# Patient Record
Sex: Female | Born: 1970 | Race: White | Hispanic: No | Marital: Single
Health system: Midwestern US, Community
[De-identification: ages and names within clinical notes are randomized; demographics above are authoritative.]

## PROBLEM LIST (undated history)

## (undated) DIAGNOSIS — Z8489 Family history of other specified conditions: Secondary | ICD-10-CM

## (undated) DIAGNOSIS — F32A Depression, unspecified: Secondary | ICD-10-CM

## (undated) DIAGNOSIS — L709 Acne, unspecified: Secondary | ICD-10-CM

## (undated) DIAGNOSIS — R112 Nausea with vomiting, unspecified: Secondary | ICD-10-CM

## (undated) DIAGNOSIS — J3089 Other allergic rhinitis: Secondary | ICD-10-CM

## (undated) DIAGNOSIS — N926 Irregular menstruation, unspecified: Secondary | ICD-10-CM

## (undated) DIAGNOSIS — R7303 Prediabetes: Secondary | ICD-10-CM

## (undated) DIAGNOSIS — N946 Dysmenorrhea, unspecified: Secondary | ICD-10-CM

## (undated) DIAGNOSIS — T8859XA Other complications of anesthesia, initial encounter: Secondary | ICD-10-CM

## (undated) DIAGNOSIS — J45909 Unspecified asthma, uncomplicated: Secondary | ICD-10-CM

## (undated) DIAGNOSIS — M1990 Unspecified osteoarthritis, unspecified site: Secondary | ICD-10-CM

## (undated) DIAGNOSIS — Z973 Presence of spectacles and contact lenses: Secondary | ICD-10-CM

## (undated) DIAGNOSIS — J189 Pneumonia, unspecified organism: Secondary | ICD-10-CM

## (undated) DIAGNOSIS — A6000 Herpesviral infection of urogenital system, unspecified: Secondary | ICD-10-CM

## (undated) HISTORY — DX: Dysmenorrhea, unspecified: N94.6

## (undated) HISTORY — DX: Other complications of anesthesia, initial encounter: T88.59XA

## (undated) HISTORY — DX: Nausea with vomiting, unspecified: R11.2

## (undated) HISTORY — DX: Acne, unspecified: L70.9

## (undated) HISTORY — DX: Pneumonia, unspecified organism: J18.9

## (undated) HISTORY — DX: Unspecified osteoarthritis, unspecified site: M19.90

## (undated) HISTORY — DX: Irregular menstruation, unspecified: N92.6

## (undated) HISTORY — DX: Depression, unspecified: F32.A

## (undated) HISTORY — DX: Prediabetes: R73.03

## (undated) HISTORY — DX: Unspecified asthma, uncomplicated: J45.909

---

## 1999-02-11 ENCOUNTER — Emergency Department (HOSPITAL_COMMUNITY): Admission: EM | Admit: 1999-02-11 | Discharge: 1999-02-11 | Payer: Self-pay | Admitting: Emergency Medicine

## 1999-06-21 HISTORY — PX: COLONOSCOPY: SHX174

## 2000-02-02 ENCOUNTER — Ambulatory Visit (HOSPITAL_COMMUNITY): Admission: RE | Admit: 2000-02-02 | Discharge: 2000-02-02 | Payer: Self-pay | Admitting: Gastroenterology

## 2001-04-13 ENCOUNTER — Other Ambulatory Visit: Admission: RE | Admit: 2001-04-13 | Discharge: 2001-04-13 | Payer: Self-pay | Admitting: Internal Medicine

## 2001-10-26 ENCOUNTER — Other Ambulatory Visit: Admission: RE | Admit: 2001-10-26 | Discharge: 2001-10-26 | Payer: Self-pay | Admitting: Obstetrics and Gynecology

## 2002-02-13 ENCOUNTER — Ambulatory Visit (HOSPITAL_COMMUNITY): Admission: RE | Admit: 2002-02-13 | Discharge: 2002-02-13 | Payer: Self-pay | Admitting: Obstetrics and Gynecology

## 2002-02-13 ENCOUNTER — Encounter: Payer: Self-pay | Admitting: Obstetrics and Gynecology

## 2002-03-08 ENCOUNTER — Inpatient Hospital Stay (HOSPITAL_COMMUNITY): Admission: AD | Admit: 2002-03-08 | Discharge: 2002-03-08 | Payer: Self-pay | Admitting: Obstetrics and Gynecology

## 2002-05-29 ENCOUNTER — Inpatient Hospital Stay (HOSPITAL_COMMUNITY): Admission: AD | Admit: 2002-05-29 | Discharge: 2002-05-31 | Payer: Self-pay | Admitting: Obstetrics and Gynecology

## 2002-07-19 ENCOUNTER — Encounter: Admission: RE | Admit: 2002-07-19 | Discharge: 2002-08-18 | Payer: Self-pay | Admitting: Obstetrics and Gynecology

## 2003-06-09 ENCOUNTER — Other Ambulatory Visit: Admission: RE | Admit: 2003-06-09 | Discharge: 2003-06-09 | Payer: Self-pay | Admitting: Obstetrics and Gynecology

## 2004-07-05 ENCOUNTER — Other Ambulatory Visit: Admission: RE | Admit: 2004-07-05 | Discharge: 2004-07-05 | Payer: Self-pay | Admitting: Obstetrics and Gynecology

## 2005-07-13 ENCOUNTER — Other Ambulatory Visit: Admission: RE | Admit: 2005-07-13 | Discharge: 2005-07-13 | Payer: Self-pay | Admitting: Obstetrics and Gynecology

## 2006-04-13 ENCOUNTER — Encounter: Admission: RE | Admit: 2006-04-13 | Discharge: 2006-07-12 | Payer: Self-pay | Admitting: Obstetrics and Gynecology

## 2006-05-25 ENCOUNTER — Inpatient Hospital Stay (HOSPITAL_COMMUNITY): Admission: AD | Admit: 2006-05-25 | Discharge: 2006-05-25 | Payer: Self-pay | Admitting: Obstetrics and Gynecology

## 2006-05-30 ENCOUNTER — Ambulatory Visit (HOSPITAL_COMMUNITY): Admission: RE | Admit: 2006-05-30 | Discharge: 2006-05-30 | Payer: Self-pay | Admitting: Obstetrics and Gynecology

## 2006-06-12 ENCOUNTER — Inpatient Hospital Stay (HOSPITAL_COMMUNITY): Admission: AD | Admit: 2006-06-12 | Discharge: 2006-06-12 | Payer: Self-pay | Admitting: Obstetrics and Gynecology

## 2006-06-19 ENCOUNTER — Ambulatory Visit (HOSPITAL_COMMUNITY): Admission: RE | Admit: 2006-06-19 | Discharge: 2006-06-19 | Payer: Self-pay | Admitting: Obstetrics and Gynecology

## 2006-07-18 ENCOUNTER — Inpatient Hospital Stay (HOSPITAL_COMMUNITY): Admission: RE | Admit: 2006-07-18 | Discharge: 2006-07-21 | Payer: Self-pay | Admitting: Obstetrics and Gynecology

## 2010-07-27 ENCOUNTER — Other Ambulatory Visit: Payer: Self-pay | Admitting: Internal Medicine

## 2010-07-27 ENCOUNTER — Ambulatory Visit
Admission: RE | Admit: 2010-07-27 | Discharge: 2010-07-27 | Disposition: A | Discharge status: Home / Self Care | Payer: 59 | Source: Ambulatory Visit | Attending: Internal Medicine | Admitting: Internal Medicine

## 2010-07-27 DIAGNOSIS — R52 Pain, unspecified: Secondary | ICD-10-CM

## 2011-03-17 ENCOUNTER — Ambulatory Visit
Admission: RE | Admit: 2011-03-17 | Discharge: 2011-03-17 | Disposition: A | Discharge status: Home / Self Care | Payer: 59 | Source: Ambulatory Visit | Attending: Internal Medicine | Admitting: Internal Medicine

## 2011-03-17 ENCOUNTER — Other Ambulatory Visit: Payer: Self-pay | Admitting: Internal Medicine

## 2011-03-17 DIAGNOSIS — R05 Cough: Secondary | ICD-10-CM

## 2011-04-29 ENCOUNTER — Ambulatory Visit
Admission: RE | Admit: 2011-04-29 | Discharge: 2011-04-29 | Disposition: A | Discharge status: Home / Self Care | Payer: 59 | Source: Ambulatory Visit | Attending: Internal Medicine | Admitting: Internal Medicine

## 2011-04-29 ENCOUNTER — Other Ambulatory Visit: Payer: Self-pay | Admitting: Internal Medicine

## 2011-04-29 DIAGNOSIS — R05 Cough: Secondary | ICD-10-CM

## 2011-09-23 ENCOUNTER — Encounter: Payer: 59 | Admitting: Obstetrics and Gynecology

## 2012-05-01 ENCOUNTER — Other Ambulatory Visit: Payer: Self-pay

## 2012-05-01 MED ORDER — VALACYCLOVIR HCL 500 MG PO TABS: 500.0000 mg | ORAL_TABLET | Freq: Every day | ORAL | Status: DC

## 2012-05-07 ENCOUNTER — Encounter: Payer: Self-pay | Admitting: Obstetrics and Gynecology

## 2012-05-07 ENCOUNTER — Ambulatory Visit (INDEPENDENT_AMBULATORY_CARE_PROVIDER_SITE_OTHER): Payer: 59 | Admitting: Obstetrics and Gynecology

## 2012-05-07 VITALS — BP 100/62 | Resp 16 | Ht 68.0 in | Wt 210.0 lb

## 2012-05-07 DIAGNOSIS — Z01419 Encounter for gynecological examination (general) (routine) without abnormal findings: Secondary | ICD-10-CM

## 2012-05-07 DIAGNOSIS — B009 Herpesviral infection, unspecified: Secondary | ICD-10-CM | POA: Insufficient documentation

## 2012-05-07 DIAGNOSIS — L709 Acne, unspecified: Secondary | ICD-10-CM | POA: Insufficient documentation

## 2012-05-07 DIAGNOSIS — N946 Dysmenorrhea, unspecified: Secondary | ICD-10-CM | POA: Insufficient documentation

## 2012-05-07 DIAGNOSIS — Z124 Encounter for screening for malignant neoplasm of cervix: Secondary | ICD-10-CM

## 2012-05-07 DIAGNOSIS — N926 Irregular menstruation, unspecified: Secondary | ICD-10-CM | POA: Insufficient documentation

## 2012-05-07 MED ORDER — VALACYCLOVIR HCL 500 MG PO TABS: 500.0000 mg | ORAL_TABLET | Freq: Every day | ORAL | Status: AC

## 2012-05-07 MED ORDER — IBUPROFEN 800 MG PO TABS: 800.0000 mg | ORAL_TABLET | Freq: Three times a day (TID) | ORAL | Status: DC | PRN

## 2012-05-07 NOTE — Progress Notes (Signed)
ANNUAL GYNECOLOGIC EXAMINATION   Cheryl Lynch is a 41 y.o. female, G3P2, who presents for an annual exam. The patient had a Mirena IUD placed December 2012 because of fibroids and heavy bleeding.  She is no longer having periods.  She complains of increased acne.  She has a history of herpes. She is having trouble losing weight. She has occasional stress incontinence.    History   Social History  . Marital Status: Married    Spouse Name: N/A    Number of Children: N/A  . Years of Education: N/A   Social History Main Topics  . Smoking status: Never Smoker   . Smokeless tobacco: Never Used  . Alcohol Use: Yes     Comment: socially   . Drug Use: No  . Sexually Active: Yes -- Female partner(s)    Birth Control/ Protection: IUD   Other Topics Concern  . None   Social History Narrative  . None    Menstrual cycle:   LMP: Patient's last menstrual period was 08/08/2011.             The following portions of the patient's history were reviewed and updated as appropriate: allergies, current medications, past family history, past medical history, past social history, past surgical history and problem list.  Review of Systems Pertinent items are noted in HPI. Breast:Negative for breast lump,nipple discharge or nipple retraction Gastrointestinal: Negative for abdominal pain, change in bowel habits or rectal bleeding Urinary:negative   Objective:    BP 100/62  Resp 16  Ht 5\' 8"  (1.727 m)  Wt 210 lb (95.255 kg)  BMI 31.93 kg/m2  LMP 08/08/2011    Weight:  Wt Readings from Last 1 Encounters:  05/07/12 210 lb (95.255 kg)          BMI: Body mass index is 31.93 kg/(m^2).  General Appearance: Alert, appropriate appearance for age. No acute distress HEENT: Grossly normal, acne Neck / Thyroid: Supple, no masses, nodes or enlargement Lungs: clear to auscultation bilaterally Back: No CVA tenderness Breast Exam: No masses or nodes.No dimpling, nipple retraction or  discharge. Cardiovascular: Regular rate and rhythm. S1, S2, no murmur Gastrointestinal: Soft, non-tender, no masses or organomegaly  ++++++++++++++++++++++++++++++++++++++++++++++++++++++++  Pelvic Exam: External genitalia: normal general appearance Vaginal: normal without tenderness, induration or masses. Relaxation: Yes Cervix: normal appearance, IUD string present Adnexa: normal bimanual exam Uterus: upper limits of normal size, shape, and consistency Rectovaginal: normal rectal, no masses  ++++++++++++++++++++++++++++++++++++++++++++++++++++++++  Lymphatic Exam: Non-palpable nodes in neck, clavicular, axillary, or inguinal regions Neurologic: Normal speech, no tremor  Psychiatric: Alert and oriented, appropriate affect.  Assessment:    Normal gyn exam   Overweight or obese: Yes   Pelvic relaxation: Yes  Acne  Herpes virus  Stable fibroids   Plan:    pap smear return annually or prn Contraception:IUD  Referred to a dermatologist    Medications prescribed: Valtrex 500 mg each day Motrin 800 mg every 8 hours as needed for pain  STD screen request: No   The updated Pap smear screening guidelines were discussed with the patient. The patient requested that I obtain a Pap smear: Yes.  Kegel exercises discussed: Yes.  Proper diet and regular exercise were reviewed.  Annual mammograms recommended starting at age 28. Proper breast care was discussed.  Screening colonoscopy is recommended beginning at age 25.  Regular health maintenance was reviewed.  Sleep hygiene was discussed.  Adequate calcium and vitamin D intake was emphasized.  Mylinda Latina.D.  Regular Periods: no Mammogram: no  Monthly Breast Ex.: no Exercise: yes  Tetanus < 10 years: no *Pt is unsure* Seatbelts: yes  NI. Bladder Functn.: no "Pt notices when she sneezes she leaks a little " Abuse at home: no  Daily BM's: yes Stressful Work: yes  Healthy Diet: yes  Sigmoid-Colonoscopy: about 10 years ago per pt   Calcium: no Medical problems this year: pt believes mirena IUD has caused her acne to flare up.    LAST PAP:01/31/2011 "WNL"   Contraception: Mirena   Mammogram:  N/A   PCP: Dr.Kimberly Renae Gloss   PMH: No Changes  FMH: No Changes  Last Bone Scan: Never

## 2012-05-09 LAB — PAP IG W/ RFLX HPV ASCU

## 2014-01-28 ENCOUNTER — Other Ambulatory Visit: Payer: Self-pay | Admitting: Obstetrics and Gynecology

## 2014-01-28 DIAGNOSIS — Z1231 Encounter for screening mammogram for malignant neoplasm of breast: Secondary | ICD-10-CM

## 2014-02-03 ENCOUNTER — Ambulatory Visit
Admission: RE | Admit: 2014-02-03 | Discharge: 2014-02-03 | Disposition: A | Discharge status: Home / Self Care | Payer: 59 | Source: Ambulatory Visit | Attending: Obstetrics and Gynecology | Admitting: Obstetrics and Gynecology

## 2014-02-03 DIAGNOSIS — Z1231 Encounter for screening mammogram for malignant neoplasm of breast: Secondary | ICD-10-CM

## 2014-02-05 ENCOUNTER — Other Ambulatory Visit: Payer: Self-pay | Admitting: Obstetrics and Gynecology

## 2014-02-05 DIAGNOSIS — R928 Other abnormal and inconclusive findings on diagnostic imaging of breast: Secondary | ICD-10-CM

## 2014-02-11 ENCOUNTER — Ambulatory Visit
Admission: RE | Admit: 2014-02-11 | Discharge: 2014-02-11 | Disposition: A | Discharge status: Home / Self Care | Payer: 59 | Source: Ambulatory Visit | Attending: Obstetrics and Gynecology | Admitting: Obstetrics and Gynecology

## 2014-02-11 DIAGNOSIS — R928 Other abnormal and inconclusive findings on diagnostic imaging of breast: Secondary | ICD-10-CM

## 2014-04-22 ENCOUNTER — Ambulatory Visit: Payer: Self-pay | Admitting: Podiatry

## 2014-04-22 MED ORDER — IBUPROFEN 800 MG PO TABS: 800.0000 mg | ORAL_TABLET | Freq: Three times a day (TID) | ORAL | Status: DC | PRN

## 2014-04-22 NOTE — Progress Notes (Unsigned)
Entry Error

## 2014-04-23 ENCOUNTER — Encounter: Payer: Self-pay | Admitting: Podiatry

## 2014-04-23 ENCOUNTER — Ambulatory Visit (INDEPENDENT_AMBULATORY_CARE_PROVIDER_SITE_OTHER): Payer: 59 | Admitting: Podiatry

## 2014-04-23 VITALS — BP 129/83 | HR 68 | Ht 68.0 in | Wt 215.0 lb

## 2014-04-23 DIAGNOSIS — M7741 Metatarsalgia, right foot: Secondary | ICD-10-CM

## 2014-04-23 DIAGNOSIS — M79606 Pain in leg, unspecified: Secondary | ICD-10-CM | POA: Insufficient documentation

## 2014-04-23 DIAGNOSIS — M216X9 Other acquired deformities of unspecified foot: Secondary | ICD-10-CM | POA: Insufficient documentation

## 2014-04-23 DIAGNOSIS — M7742 Metatarsalgia, left foot: Secondary | ICD-10-CM

## 2014-04-23 DIAGNOSIS — L609 Nail disorder, unspecified: Secondary | ICD-10-CM | POA: Insufficient documentation

## 2014-04-23 DIAGNOSIS — M21969 Unspecified acquired deformity of unspecified lower leg: Secondary | ICD-10-CM | POA: Insufficient documentation

## 2014-04-23 NOTE — Patient Instructions (Signed)
Seen for painful feet. Both feet casted for orthotics and Metatarsal binder dispensed.

## 2014-04-23 NOTE — Progress Notes (Signed)
Subjective: 43 year old female presents with painful feet. She has had heel pain and was treated with Orthotics in past. Been wearing orthotics since 2009. They were effective for a while. Now they are not effective and feet are hurting on side of foot, heels, through out the foot and some times the pain shoots up to legs. Also the orthotics do not fit in dress shoes. Been doing this for over a year. Not on feet constant, but off and on 3-4 hours. Patient prefers to have orthotics for dress shoes if needed a new pair. Also stated that she has problematic nails that are difficult to trim.   Objective: Dermatologic: Abnormal skin growth under distal nail plates, 2-3 bilateral. Neurovascular status are within normal. Orthopedic: Flexible flat foot. Hypermobile first ray bilateral. Hallux valgus with bunion R>L.  Radiographic examination reveal short first ray/long 2nd metatarsal, and dorsally elevated first ray bilateral.  Assessment: Metatarsalgia bilateral. Hypermobile first ray bilateral. STJ hyperpronation bilateral. Lateral column foot pain. Abnormal nail growth.  Plan: Reviewed findings and available treatment options.  Metatarsal binder dispensed to provide added stability of the first ray. Both feet casted for Orthotics to fit dress shoes. Explained decreased effectiveness of orthotics when used with dress shoes. Advised to use old orthotics for Tennis shoes. May benefit from [place] osteotomy with bone graft to bring the first ray down and increase the length.

## 2014-08-01 ENCOUNTER — Ambulatory Visit: Payer: Self-pay | Admitting: Podiatry

## 2015-02-16 ENCOUNTER — Other Ambulatory Visit: Payer: Self-pay | Admitting: Obstetrics and Gynecology

## 2015-02-16 DIAGNOSIS — R921 Mammographic calcification found on diagnostic imaging of breast: Secondary | ICD-10-CM

## 2015-02-26 ENCOUNTER — Ambulatory Visit
Admission: RE | Admit: 2015-02-26 | Discharge: 2015-02-26 | Disposition: A | Discharge status: Home / Self Care | Payer: Commercial Managed Care - HMO | Source: Ambulatory Visit | Attending: Obstetrics and Gynecology | Admitting: Obstetrics and Gynecology

## 2015-02-26 DIAGNOSIS — R921 Mammographic calcification found on diagnostic imaging of breast: Secondary | ICD-10-CM

## 2015-11-03 ENCOUNTER — Other Ambulatory Visit: Payer: Self-pay | Admitting: Obstetrics and Gynecology

## 2015-11-12 ENCOUNTER — Other Ambulatory Visit: Payer: Self-pay | Admitting: Obstetrics and Gynecology

## 2015-12-01 ENCOUNTER — Other Ambulatory Visit: Payer: Self-pay | Admitting: Obstetrics and Gynecology

## 2015-12-09 ENCOUNTER — Encounter (HOSPITAL_BASED_OUTPATIENT_CLINIC_OR_DEPARTMENT_OTHER): Payer: Self-pay | Admitting: *Deleted

## 2015-12-09 NOTE — Progress Notes (Signed)
NPO AFTER MN.  ARRIVE  AT 0700.  NEEDS HG AND URINE PREG.

## 2015-12-10 ENCOUNTER — Inpatient Hospital Stay (HOSPITAL_COMMUNITY)
Admission: RE | Admit: 2015-12-10 | Discharge: 2015-12-10 | Disposition: A | Discharge status: Home / Self Care | Payer: Commercial Managed Care - HMO | Source: Ambulatory Visit

## 2015-12-14 NOTE — H&P (Signed)
Admission History and Physical Exam for a Gynecology Patient  Cheryl Lynch is a 45 y.o. female, G3P2, who presents for removal of a Mirena IUD, laparoscopic bilateral tubal sterilization, and NovaSure ablation of endometrium. She has been followed at the [facility] and Gynecology division of [facility]. She has had a Mirena IUD since 2012.  She is experiencing menorrhagia and dysmenorrhea.  She now elects for permanent sterilization and endometrial ablation.  Her endometrial biopsy was negative.  Most recent Pap smear was within normal limits.  OB History    Gravida Para Term Preterm AB TAB SAB Ectopic Multiple Living   3 2              Past Medical History  Diagnosis Date  . Dysmenorrhea   . Irregular periods/menstrual cycles   . Acne   . Genital HSV   . Wears contact lenses   . Environmental and seasonal allergies   . Family history of adverse reaction to anesthesia     mother-- ponv    No prescriptions prior to admission    Past Surgical History  Procedure Laterality Date  . Cesarean section  07-18-2001  . Colonoscopy  2001    Allergies  Allergen Reactions  . Latex Itching    Family History: family history includes Cancer in her maternal grandmother; Diabetes in her father.  Social History:  reports that she has never smoked. She has never used smokeless tobacco. She reports that she drinks alcohol. She reports that she does not use illicit drugs.  Review of systems: See HPI.  Admission Physical Exam:    Body mass index is 28.59 kg/(m^2).  Height 5\' 8"  (1.727 m), weight 188 lb (85.276 kg).  HEENT:                 Within normal limits Chest:                   Clear Heart:                    Regular rate and rhythm Breasts:                No masses, skin changes, bleeding, or discharge present Abdomen:             Nontender, no masses Extremities:          Grossly normal Neurologic exam: Grossly normal  Pelvic  exam:  External genitalia: normal general appearance Vaginal: cystocele present Cervix: normal appearance and IUD string visualized Adnexa: normal bimanual exam Uterus: normal size shape and consistency  Assessment:  Desires sterilization  Menorrhagia  Dysmenorrhea  Plan:  The patient will have her Mirena IUD removed.  She will then have a laparoscopic tubal sterilization procedure.  She will have an ablation of the endometrium.  The patient understands the indications for surgical procedure.  She understands her alternatives treatment options.  She accepts the risk of, but not limited to, anesthetic complications, bleeding, infections, and possible damage to the surrounding organs.   Eli Hose 12/14/2015

## 2015-12-14 NOTE — Anesthesia Preprocedure Evaluation (Addendum)
Anesthesia Evaluation  Patient identified by MRN, date of birth, ID band Patient awake    Reviewed: Allergy & Precautions, NPO status , Patient's Chart, lab work & pertinent test results  Airway Mallampati: II  TM Distance: >3 FB Neck ROM: Full    Dental  (+) Dental Advisory Given   Pulmonary neg pulmonary ROS,    breath sounds clear to auscultation       Cardiovascular negative cardio ROS   Rhythm:Regular Rate:Normal     Neuro/Psych negative neurological ROS     GI/Hepatic negative GI ROS, Neg liver ROS,   Endo/Other  negative endocrine ROS  Renal/GU negative Renal ROS     Musculoskeletal   Abdominal   Peds  Hematology negative hematology ROS (+)   Anesthesia Other Findings   Reproductive/Obstetrics                           No results found for: WBC, HGB, HCT, MCV, PLT No results found for: CREATININE, BUN, NA, K, CL, CO2  Anesthesia Physical Anesthesia Plan  ASA: I  Anesthesia Plan: General   Post-op Pain Management:    Induction: Intravenous  Airway Management Planned: Oral ETT  Additional Equipment:   Intra-op Plan:   Post-operative Plan: Extubation in OR  Informed Consent: I have reviewed the patients History and Physical, chart, labs and discussed the procedure including the risks, benefits and alternatives for the proposed anesthesia with the patient or authorized representative who has indicated his/her understanding and acceptance.   Dental advisory given  Plan Discussed with: CRNA  Anesthesia Plan Comments:        Anesthesia Quick Evaluation

## 2015-12-15 ENCOUNTER — Encounter (HOSPITAL_BASED_OUTPATIENT_CLINIC_OR_DEPARTMENT_OTHER): Payer: Self-pay | Admitting: *Deleted

## 2015-12-15 ENCOUNTER — Ambulatory Visit (HOSPITAL_BASED_OUTPATIENT_CLINIC_OR_DEPARTMENT_OTHER): Payer: Commercial Managed Care - HMO | Admitting: Anesthesiology

## 2015-12-15 ENCOUNTER — Encounter (HOSPITAL_BASED_OUTPATIENT_CLINIC_OR_DEPARTMENT_OTHER)
Admission: RE | Disposition: A | Discharge status: Home / Self Care | Payer: Self-pay | Source: Ambulatory Visit | Attending: Obstetrics and Gynecology

## 2015-12-15 ENCOUNTER — Ambulatory Visit (HOSPITAL_BASED_OUTPATIENT_CLINIC_OR_DEPARTMENT_OTHER)
Admission: RE | Admit: 2015-12-15 | Discharge: 2015-12-15 | Disposition: A | Discharge status: Home / Self Care | Payer: Commercial Managed Care - HMO | Source: Ambulatory Visit | Attending: Obstetrics and Gynecology | Admitting: Obstetrics and Gynecology

## 2015-12-15 DIAGNOSIS — Z79899 Other long term (current) drug therapy: Secondary | ICD-10-CM | POA: Diagnosis not present

## 2015-12-15 DIAGNOSIS — Z302 Encounter for sterilization: Secondary | ICD-10-CM | POA: Diagnosis not present

## 2015-12-15 DIAGNOSIS — Q512 Other doubling of uterus: Secondary | ICD-10-CM | POA: Insufficient documentation

## 2015-12-15 DIAGNOSIS — Z466 Encounter for fitting and adjustment of urinary device: Secondary | ICD-10-CM | POA: Insufficient documentation

## 2015-12-15 DIAGNOSIS — N920 Excessive and frequent menstruation with regular cycle: Secondary | ICD-10-CM | POA: Insufficient documentation

## 2015-12-15 HISTORY — PX: DILITATION & CURRETTAGE/HYSTROSCOPY WITH NOVASURE ABLATION: SHX5568

## 2015-12-15 HISTORY — DX: Presence of spectacles and contact lenses: Z97.3

## 2015-12-15 HISTORY — DX: Family history of other specified conditions: Z84.89

## 2015-12-15 HISTORY — DX: Herpesviral infection of urogenital system, unspecified: A60.00

## 2015-12-15 HISTORY — DX: Other allergic rhinitis: J30.89

## 2015-12-15 LAB — POCT I-STAT 4, (NA,K, GLUC, HGB,HCT)
GLUCOSE: 90 mg/dL (ref 65–99)
HEMATOCRIT: 38 % (ref 36.0–46.0)
HEMOGLOBIN: 12.9 g/dL (ref 12.0–15.0)
Potassium: 3.8 mmol/L (ref 3.5–5.1)
Sodium: 140 mmol/L (ref 135–145)

## 2015-12-15 LAB — POCT PREGNANCY, URINE: Preg Test, Ur: NEGATIVE

## 2015-12-15 SURGERY — DILATATION & CURETTAGE/HYSTEROSCOPY WITH NOVASURE ABLATION
Anesthesia: General

## 2015-12-15 MED ORDER — LACTATED RINGERS IV SOLN
INTRAVENOUS | Status: DC
  Administered 2015-12-15 (×3): via INTRAVENOUS
  Filled 2015-12-15: qty 1000

## 2015-12-15 MED ORDER — FENTANYL CITRATE (PF) 100 MCG/2ML IJ SOLN
INTRAMUSCULAR | Status: AC
  Filled 2015-12-15: qty 2

## 2015-12-15 MED ORDER — IBUPROFEN 800 MG PO TABS: 800.0000 mg | ORAL_TABLET | Freq: Three times a day (TID) | ORAL | Status: DC | PRN

## 2015-12-15 MED ORDER — WHITE PETROLATUM GEL
Status: AC
  Filled 2015-12-15: qty 5

## 2015-12-15 MED ORDER — HYDROCODONE-ACETAMINOPHEN 7.5-325 MG PO TABS
1.0000 | ORAL_TABLET | Freq: Once | ORAL | Status: AC | PRN
  Administered 2015-12-15: 1 via ORAL
  Filled 2015-12-15: qty 1

## 2015-12-15 MED ORDER — PROMETHAZINE HCL 25 MG/ML IJ SOLN
6.2500 mg | INTRAMUSCULAR | Status: DC | PRN
  Filled 2015-12-15: qty 1

## 2015-12-15 MED ORDER — LIDOCAINE 2% (20 MG/ML) 5 ML SYRINGE
INTRAMUSCULAR | Status: DC | PRN
  Administered 2015-12-15: 60 mg via INTRAVENOUS

## 2015-12-15 MED ORDER — MIDAZOLAM HCL 2 MG/2ML IJ SOLN
INTRAMUSCULAR | Status: AC
  Filled 2015-12-15: qty 2

## 2015-12-15 MED ORDER — LACTATED RINGERS IR SOLN
Status: DC | PRN
  Administered 2015-12-15: 3000 mL

## 2015-12-15 MED ORDER — HYDROMORPHONE HCL 1 MG/ML IJ SOLN
INTRAMUSCULAR | Status: AC
  Filled 2015-12-15: qty 1

## 2015-12-15 MED ORDER — MIDAZOLAM HCL 5 MG/5ML IJ SOLN
INTRAMUSCULAR | Status: DC | PRN
  Administered 2015-12-15: 2 mg via INTRAVENOUS

## 2015-12-15 MED ORDER — SCOPOLAMINE 1 MG/3DAYS TD PT72
MEDICATED_PATCH | TRANSDERMAL | Status: AC
  Filled 2015-12-15: qty 1

## 2015-12-15 MED ORDER — HYDROMORPHONE HCL 1 MG/ML IJ SOLN
0.2500 mg | INTRAMUSCULAR | Status: DC | PRN
  Administered 2015-12-15: 0.5 mg via INTRAVENOUS
  Filled 2015-12-15: qty 1

## 2015-12-15 MED ORDER — PROPOFOL 10 MG/ML IV BOLUS
INTRAVENOUS | Status: DC | PRN
  Administered 2015-12-15: 180 mg via INTRAVENOUS

## 2015-12-15 MED ORDER — PROPOFOL 10 MG/ML IV BOLUS
INTRAVENOUS | Status: AC
  Filled 2015-12-15: qty 20

## 2015-12-15 MED ORDER — ROCURONIUM BROMIDE 100 MG/10ML IV SOLN
INTRAVENOUS | Status: AC
Start: 2015-12-15 — End: 2015-12-15
  Filled 2015-12-15: qty 1

## 2015-12-15 MED ORDER — ROCURONIUM BROMIDE 100 MG/10ML IV SOLN
INTRAVENOUS | Status: DC | PRN
Start: 2015-12-15 — End: 2015-12-15
  Administered 2015-12-15: 15 mg via INTRAVENOUS
  Administered 2015-12-15 (×2): 5 mg via INTRAVENOUS

## 2015-12-15 MED ORDER — KETOROLAC TROMETHAMINE 30 MG/ML IJ SOLN
30.0000 mg | Freq: Once | INTRAMUSCULAR | Status: DC | PRN
  Filled 2015-12-15: qty 1

## 2015-12-15 MED ORDER — ONDANSETRON HCL 4 MG/2ML IJ SOLN
INTRAMUSCULAR | Status: AC
  Filled 2015-12-15: qty 2

## 2015-12-15 MED ORDER — HYDROCODONE-ACETAMINOPHEN 7.5-325 MG PO TABS
ORAL_TABLET | ORAL | Status: AC
  Filled 2015-12-15: qty 1

## 2015-12-15 MED ORDER — LIDOCAINE HCL (CARDIAC) 20 MG/ML IV SOLN: INTRAVENOUS | Status: DC | PRN

## 2015-12-15 MED ORDER — ONDANSETRON HCL 4 MG/2ML IJ SOLN
INTRAMUSCULAR | Status: DC | PRN
  Administered 2015-12-15: 4 mg via INTRAVENOUS

## 2015-12-15 MED ORDER — BUPIVACAINE-EPINEPHRINE 0.5% -1:200000 IJ SOLN
INTRAMUSCULAR | Status: DC | PRN
  Administered 2015-12-15: 7 mL
  Administered 2015-12-15: 10 mL

## 2015-12-15 MED ORDER — SCOPOLAMINE 1 MG/3DAYS TD PT72
MEDICATED_PATCH | TRANSDERMAL | Status: DC | PRN
  Administered 2015-12-15: 1 via TRANSDERMAL

## 2015-12-15 MED ORDER — DEXAMETHASONE SODIUM PHOSPHATE 4 MG/ML IJ SOLN
INTRAMUSCULAR | Status: DC | PRN
  Administered 2015-12-15: 10 mg via INTRAVENOUS

## 2015-12-15 MED ORDER — SODIUM CHLORIDE 0.9 % IR SOLN
Status: DC | PRN
  Administered 2015-12-15: 3000 mL
  Administered 2015-12-15: 500 mL

## 2015-12-15 MED ORDER — FENTANYL CITRATE (PF) 100 MCG/2ML IJ SOLN
INTRAMUSCULAR | Status: DC | PRN
  Administered 2015-12-15: 25 ug via INTRAVENOUS
  Administered 2015-12-15 (×3): 50 ug via INTRAVENOUS

## 2015-12-15 MED ORDER — SUGAMMADEX SODIUM 200 MG/2ML IV SOLN
INTRAVENOUS | Status: AC
  Filled 2015-12-15: qty 2

## 2015-12-15 MED ORDER — HYDROMORPHONE HCL 2 MG PO TABS: 2.0000 mg | ORAL_TABLET | ORAL | Status: DC | PRN

## 2015-12-15 MED ORDER — LIDOCAINE HCL (CARDIAC) 20 MG/ML IV SOLN
INTRAVENOUS | Status: AC
  Filled 2015-12-15: qty 5

## 2015-12-15 MED ORDER — DEXAMETHASONE SODIUM PHOSPHATE 10 MG/ML IJ SOLN
INTRAMUSCULAR | Status: AC
Start: 2015-12-15 — End: 2015-12-15
  Filled 2015-12-15: qty 1

## 2015-12-15 MED ORDER — KETOROLAC TROMETHAMINE 30 MG/ML IJ SOLN
INTRAMUSCULAR | Status: DC | PRN
  Administered 2015-12-15: 30 mg via INTRAVENOUS

## 2015-12-15 MED ORDER — SUGAMMADEX SODIUM 200 MG/2ML IV SOLN
INTRAVENOUS | Status: DC | PRN
  Administered 2015-12-15: 200 mg via INTRAVENOUS

## 2015-12-15 MED ORDER — SUCCINYLCHOLINE CHLORIDE 200 MG/10ML IV SOSY
PREFILLED_SYRINGE | INTRAVENOUS | Status: DC | PRN
  Administered 2015-12-15: 100 mg via INTRAVENOUS

## 2015-12-15 SURGICAL SUPPLY — 69 items
ABLATOR ENDOMETRIAL BIPOLAR (ABLATOR) ×4 IMPLANT
BAG URINE DRAINAGE (UROLOGICAL SUPPLIES) IMPLANT
BANDAGE ADH SHEER 1  50/CT (GAUZE/BANDAGES/DRESSINGS) ×12 IMPLANT
BLADE SURG 11 STRL SS (BLADE) ×4 IMPLANT
CANISTER SUCTION 2500CC (MISCELLANEOUS) ×4 IMPLANT
CATH SILICONE 14FRX5CC (CATHETERS) ×4 IMPLANT
CHLORAPREP W/TINT 26ML (MISCELLANEOUS) ×4 IMPLANT
CLOSURE WOUND 1/4 X3 (GAUZE/BANDAGES/DRESSINGS) ×1
COVER BACK TABLE 60X90IN (DRAPES) ×4 IMPLANT
COVER MAYO STAND STRL (DRAPES) ×4 IMPLANT
DRAPE HYSTEROSCOPY (DRAPE) ×4 IMPLANT
DRAPE LG THREE QUARTER DISP (DRAPES) ×4 IMPLANT
DRAPE UNDERBUTTOCKS STRL (DRAPE) ×4 IMPLANT
DRSG COVADERM PLUS 2X2 (GAUZE/BANDAGES/DRESSINGS) IMPLANT
DRSG OPSITE POSTOP 3X4 (GAUZE/BANDAGES/DRESSINGS) IMPLANT
DRSG TELFA 3X8 NADH (GAUZE/BANDAGES/DRESSINGS) ×4 IMPLANT
ELECT REM PT RETURN 9FT ADLT (ELECTROSURGICAL) ×4
ELECTRODE REM PT RTRN 9FT ADLT (ELECTROSURGICAL) ×2 IMPLANT
FORCEPS CUTTING 33CM 5MM (CUTTING FORCEPS) IMPLANT
GAUZE VASELINE 1X8 (GAUZE/BANDAGES/DRESSINGS) IMPLANT
GLOVE BIOGEL PI IND STRL 8.5 (GLOVE) ×4 IMPLANT
GLOVE BIOGEL PI INDICATOR 8.5 (GLOVE) ×4
GLOVE SURG SS PI 8.0 STRL IVOR (GLOVE) ×16 IMPLANT
GOWN STRL REUS W/ TWL XL LVL3 (GOWN DISPOSABLE) ×4 IMPLANT
GOWN STRL REUS W/TWL 2XL LVL3 (GOWN DISPOSABLE) ×8 IMPLANT
GOWN STRL REUS W/TWL LRG LVL3 (GOWN DISPOSABLE) ×8 IMPLANT
GOWN STRL REUS W/TWL XL LVL3 (GOWN DISPOSABLE) ×4
HOLDER FOLEY CATH W/STRAP (MISCELLANEOUS) IMPLANT
IV LACTATED RINGER IRRG 3000ML (IV SOLUTION) ×2
IV LR IRRIG 3000ML ARTHROMATIC (IV SOLUTION) ×2 IMPLANT
KIT ROOM TURNOVER WOR (KITS) ×4 IMPLANT
LEGGING LITHOTOMY PAIR STRL (DRAPES) ×4 IMPLANT
LOOP ANGLED CUTTING 22FR (CUTTING LOOP) IMPLANT
NEEDLE HYPO 25X1 1.5 SAFETY (NEEDLE) ×4 IMPLANT
NEEDLE INSUFFLATION 14GA 120MM (NEEDLE) ×4 IMPLANT
NEEDLE SPNL 22GX3.5 QUINCKE BK (NEEDLE) ×4 IMPLANT
NS IRRIG 500ML POUR BTL (IV SOLUTION) ×4 IMPLANT
PACK BASIN DAY SURGERY FS (CUSTOM PROCEDURE TRAY) ×4 IMPLANT
PACK LAPAROSCOPY II (CUSTOM PROCEDURE TRAY) ×4 IMPLANT
PAD OB MATERNITY 4.3X12.25 (PERSONAL CARE ITEMS) ×4 IMPLANT
PAD PREP 24X48 CUFFED NSTRL (MISCELLANEOUS) ×4 IMPLANT
PADDING ION DISPOSABLE (MISCELLANEOUS) ×4 IMPLANT
POUCH SPECIMEN RETRIEVAL 10MM (ENDOMECHANICALS) IMPLANT
SET GENESYS HTA PROCERVA (MISCELLANEOUS) ×4 IMPLANT
SET IRRIG TUBING LAPAROSCOPIC (IRRIGATION / IRRIGATOR) IMPLANT
SHEARS HARMONIC ACE PLUS 36CM (ENDOMECHANICALS) IMPLANT
SOLUTION ANTI FOG 6CC (MISCELLANEOUS) ×4 IMPLANT
STRIP CLOSURE SKIN 1/4X3 (GAUZE/BANDAGES/DRESSINGS) ×3 IMPLANT
SUT MNCRL AB 3-0 PS2 27 (SUTURE) ×4 IMPLANT
SUT VIC AB 2-0 UR6 27 (SUTURE) ×4 IMPLANT
SUT VICRYL 0 ENDOLOOP (SUTURE) IMPLANT
SYR 30ML LL (SYRINGE) IMPLANT
SYR 50ML LL SCALE MARK (SYRINGE) ×4 IMPLANT
SYR CONTROL 10ML LL (SYRINGE) ×8 IMPLANT
SYRINGE 10CC LL (SYRINGE) ×4 IMPLANT
TOWEL OR 17X24 6PK STRL BLUE (TOWEL DISPOSABLE) ×8 IMPLANT
TRAY DSU PREP LF (CUSTOM PROCEDURE TRAY) ×4 IMPLANT
TROCAR BALLN 12MMX100 BLUNT (TROCAR) IMPLANT
TROCAR BLADELESS OPT 5 100 (ENDOMECHANICALS) IMPLANT
TROCAR OPTI TIP 5M 100M (ENDOMECHANICALS) ×4 IMPLANT
TROCAR XCEL DIL TIP R 11M (ENDOMECHANICALS) ×4 IMPLANT
TROCAR XCEL NON-BLD 11X100MML (ENDOMECHANICALS) IMPLANT
TUBE CONNECTING 12'X1/4 (SUCTIONS)
TUBE CONNECTING 12X1/4 (SUCTIONS) IMPLANT
TUBING AQUILEX INFLOW (TUBING) ×4 IMPLANT
TUBING AQUILEX OUTFLOW (TUBING) ×4 IMPLANT
TUBING INSUFFLATION 10FT LAP (TUBING) ×4 IMPLANT
WARMER LAPAROSCOPE (MISCELLANEOUS) ×4 IMPLANT
WATER STERILE IRR 500ML POUR (IV SOLUTION) IMPLANT

## 2015-12-15 NOTE — Progress Notes (Signed)
The patient was interviewed and examined today.  The previously documented history and physical examination was reviewed. There are no changes. The operative procedure was reviewed. The risks and benefits were outlined again. The specific risks include, but are not limited to, anesthetic complications, bleeding, infections, and possible damage to the surrounding organs. The patient's questions were answered.  We are ready to proceed as outlined. The likelihood of the patient achieving the goals of this procedure is very likely.   BP 123/78 mmHg  Pulse 67  Temp(Src) 98.5 F (36.9 C) (Oral)  Resp 18  Ht 5\' 8"  (1.727 m)  Wt 183 lb 8 oz (83.235 kg)  BMI 27.91 kg/m2  SpO2 98%   Gildardo Cranker, M.D.

## 2015-12-15 NOTE — Op Note (Signed)
OPERATIVE NOTE  Cheryl Lynch  DOB:    September 09, 1970  MRN:    [id number]  CSN:    [id number]  Date of Surgery:  12/15/2015  Preoperative Diagnosis:  Menorrhagia  Desires sterilization  Intrauterine device contraception  Left ovarian cyst  Postoperative Diagnosis:  Menorrhagia  Desires sterilization   Intrauterine device contraception  Uterine septum  Procedure:  Removal of intrauterine device  Diagnostic laparoscopy  Tubal sterilization procedure via bilateral salpingectomy  Unsuccessful NovaSure ablation of the endometrium  Hysteroscopy  Dilation and curettage  Hydrothermal ablation of the endometrium  Surgeon:  Gildardo Cranker, M.D.  Assistant:  None  Anesthetic:  General  Disposition:  Cheryl Lynch is a 45 y.o. year old female,G3P2, who presents for the above procedures. She understands the indications for her operation as well as the alternative treatment options. She accepts the risk of, but not limited to, anesthetic complications, bleeding, infections, possible damage to the surrounding organs, and possible tubal failure (17 per 1000). The patient elects bilateral salpingectomy because of the potential reduction of cancer risk.  Findings:  The fallopian tubes, and ovaries were normal. The bowel and the liver appeared normal. The NovaSure device did not fit inside the uterus.  Hysteroscopy showed a midline septum.  A cyst was previously noted on her left ovary by ultrasound.  There were no cysts present on her ovaries today.  Procedure:  The patient was taken to the operating room where a general anesthetic was given. The patient's abdomen, perineum, and vagina were prepped with sterile solution. The bladder was drained of urine. An examination under anesthesia was performed. The Mirena IUD was removed from the uterus. A Hulka tenaculum was placed inside the uterus. The patient was sterilely draped. The subumbilical area  was injected with half percent Marcaine with epinephrine. A subumbilical incision was made and the Veress needle was inserted into the abdominal cavity without difficulty. Proper placement was confirmed using the fluid drop test. A pneumoperitoneum was obtained. The laparoscopic trocar and the laparoscope were substituted for the Veress needle. The pelvic organs were inspected with findings as mentioned above. Half percent Marcaine with epinephrine was injected in the left lower quadrant. A 5 mm trocar was placed and the abdominal cavity under direct visualization. The right fallopian tube was identified and followed to the fimbriated end. The mesosalpinx of the right fallopian tube was cauterized until it was completely desiccated. The right fallopian tube was then cauterized approximately 1 cm from the uterus. The right fallopian tube was excised. Hemostasis was adequate. An identical procedure was carried out on the opposite side. Again hemostasis was adequate. The fallopian tubes were removed through the umbilical port. The pneumoperitoneum was allowed to escape. All instruments were removed. The subumbilical fascia was closed using 00 Vicryl. The skin was reapproximated using 3-0 Vicryl. Sponge and needle counts were correct. The estimated blood loss was less than 10 cc. The patient was placed in a more lithotomy position.  A speculum was placed in the vagina.  A paracervical block was placed using 10 cc of half percent Marcaine with epinephrine.  The uterus was sounded.  The cervical length was measured.  The cervix was gently dilated.  The NovaSure instrument was tested and was noted to function properly.  The NovaSure was placed inside the uterine cavity.  We were unable to determine an adequate cavity width.  Multiple attempts were made.  They were unsuccessful.  We then removed the NovaSure instrument, and hysteroscopy was  performed.  A midline uterine septum was noted.  The decision was made to proceed  with HTA ablation of the endometrium.  The HTA instrument was set up.  The hysteroscope was inserted through the instrument.  The HTA instrument was tested and was noted to approximate properly.  The HTA instrument was inserted into the uterine cavity.  The cavity was ablated for 10 min.  Adequate ablation was noted. The patient tolerated her procedure well. All instruments was removed. Silver nitrate was applied to the cervix.  The estimated blood loss for this portion of the procedure was 10 cc.  The estimated fluid deficit for the entire procedure was minimal.  Sponge and needle counts were correct. She was awakened from her anesthetic without difficulty. She was transported to the recovery room in stable condition. The fallopian tubes and the endometrial curettings were sent to pathology.   Followup instructions:  The patient will return to see Dr. Raphael Gibney in 2 weeks. She was given a copy of the postoperative instructions as prepared by the [facility] for patients who've undergone laparoscopy.  Discharge medications:  Motrin 800 mg tablets            One tablet every 8 hours as needed for moderate pain. Vicodin 5/500 tablets             One or two tablets every 4 hours as needed for severe pain. Phenergan 12.5 mg tablets   One tablet every 6 hours as needed for nausea.  Gildardo Cranker, M.D.

## 2015-12-15 NOTE — Transfer of Care (Signed)
Immediate Anesthesia Transfer of Care Note  Patient: Cheryl Lynch  Procedure(s) Performed: Procedure(s) (LRB): IUD REMOVAL; DIAGNOSTIC LAPAROSCOPY; BILATERAL SALPINGECTOMY; DILATATION & CURETTAGE/HYSTEROSCOPY WITH UNSUCCESSFUL NOVASURE ABLATION; HTA ABLATION OF ENDOMETRIUM (N/A)  Patient Location: PACU  Anesthesia Type: General  Level of Consciousness: awake, oriented, sedated and patient cooperative  Airway & Oxygen Therapy: Patient Spontanous Breathing and Patient connected to face mask oxygen  Post-op Assessment: Report given to PACU RN and Post -op Vital signs reviewed and stable  Post vital signs: Reviewed and stable  Complications: No apparent anesthesia complications Last Vitals:  Filed Vitals:   12/15/15 0730 12/15/15 1102  BP: 123/78 121/83  Pulse: 67 74  Temp: 36.9 C   Resp: 18 17    Last Pain: There were no vitals filed for this visit.    Patients Stated Pain Goal: 7 (12/15/15 [id number])

## 2015-12-15 NOTE — Anesthesia Procedure Notes (Signed)
Procedure Name: Intubation Date/Time: 12/15/2015 8:42 AM Performed by: Denna Haggard D Pre-anesthesia Checklist: Patient identified, Emergency Drugs available, Suction available and Patient being monitored Patient Re-evaluated:Patient Re-evaluated prior to inductionOxygen Delivery Method: Circle system utilized Preoxygenation: Pre-oxygenation with 100% oxygen Intubation Type: IV induction Ventilation: Mask ventilation without difficulty Laryngoscope Size: Mac and 3 Grade View: Grade I Tube type: Oral Tube size: 7.0 mm Number of attempts: 1 Airway Equipment and Method: Stylet and Oral airway Placement Confirmation: ETT inserted through vocal cords under direct vision,  positive ETCO2 and breath sounds checked- equal and bilateral Secured at: 21 cm Tube secured with: Tape Dental Injury: Teeth and Oropharynx as per pre-operative assessment

## 2015-12-15 NOTE — Discharge Instructions (Addendum)
Endometrial Ablation °Endometrial ablation removes the lining of the uterus (endometrium). It is usually a same-day, outpatient treatment. Ablation helps avoid major surgery, such as surgery to remove the cervix and uterus (hysterectomy). After endometrial ablation, you will have little or no menstrual bleeding and may not be able to have children. However, if you are premenopausal, you will need to use a reliable method of birth control following the procedure because of the small chance that pregnancy can occur. °There are different reasons to have this procedure. These reasons include: °· Heavy periods. °· Bleeding that is causing anemia. °· Irregular bleeding. °· Bleeding fibroids on the lining inside the uterus if they are smaller than 3 centimeters. °This procedure may not be possible for you if:  °· You want to have children in the future.   °· You have severe cramps with your menstrual period.   °· You have precancerous or cancerous cells in your uterus.   °· You were recently pregnant.   °· You have gone through menopause.   °· You have had major surgery on your uterus, resulting in thinning of the uterine wall. Surgeries may include: °¨ The removal of one or more uterine fibroids (myomectomy). °¨ A cesarean section with a classic (vertical) incision on your uterus. Ask your health care provider what type of cesarean you had. Sometimes the scar on your skin is different than the scar on your uterus. °Even if you have had surgery on your uterus, certain types of ablation may still be safe for you. Talk with your health care provider. °LET YOUR HEALTH CARE PROVIDER KNOW ABOUT: °· Any allergies you have. °· All medicines you are taking, including vitamins, herbs, eye drops, creams, and over-the-counter medicines. °· Previous problems you or members of your family have had with the use of anesthetics. °· Any blood disorders you have. °· Previous surgeries you have had. °· Medical conditions you have. °RISKS AND  COMPLICATIONS  °Generally, this is a safe procedure. However, as with any procedure, complications can occur. Possible complications include: °· Perforation of the uterus. °· Bleeding. °· Infection of the uterus, bladder, or vagina. °· Injury to surrounding organs. °· An air bubble to the lung (air embolus). °· Pregnancy following the procedure. °· Failure of the procedure to help the problem, requiring hysterectomy. °· Decreased ability to diagnose cancer in the lining of the uterus. °BEFORE THE PROCEDURE °· The lining of the uterus must be tested to make sure there is no pre-cancerous or cancer cells present. °· An ultrasound may be performed to look at the size of the uterus and to check for abnormalities. °· Medicines may be given to thin the lining of the uterus. °PROCEDURE  °During the procedure, your health care provider will use a tool called a resectoscope to help see inside your uterus. There are different ways to remove the lining of your uterus.  °· Radiofrequency - This method uses a radiofrequency-alternating electric current to remove the lining of the uterus. °· Cryotherapy - This method uses extreme cold to freeze the lining of the uterus. °· Heated-Free Liquid - This method uses heated salt (saline) solution to remove the lining of the uterus. °· Microwave - This method uses high-energy microwaves to heat up the lining of the uterus to remove it. °· Thermal balloon - This method involves inserting a catheter with a balloon tip into the uterus. The balloon tip is filled with heated fluid to remove the lining of the uterus. °AFTER THE PROCEDURE  °After your procedure, do   not have sexual intercourse or insert anything into your vagina until permitted by your health care provider. After the procedure, you may experience:  Cramps.  Vaginal discharge.  Frequent urination.   This information is not intended to replace advice given to you by your health care provider. Make sure you discuss any  questions you have with your health care provider.   Document Released: 04/15/2004 Document Revised: 02/25/2015 Document Reviewed: 11/07/2012 Elsevier Interactive Patient Education 2016 [employer] Diagnostic Laparoscopy A diagnostic laparoscopy is a procedure to diagnose diseases in the abdomen. During the procedure, a thin, lighted, pencil-sized instrument called a laparoscope is inserted into the abdomen through an incision. The laparoscope allows your health care provider to look at the organs inside your body. LET [facility] CARE PROVIDER KNOW ABOUT:  Any allergies you have.  All medicines you are taking, including vitamins, herbs, eye drops, creams, and over-the-counter medicines.  Previous problems you or members of your family have had with the use of anesthetics.  Any blood disorders you have.  Previous surgeries you have had.  Medical conditions you have. RISKS AND COMPLICATIONS  Generally, this is a safe procedure. However, problems can occur, which may include:  Infection.  Bleeding.  Damage to other organs.  Allergic reaction to the anesthetics used during the procedure. BEFORE THE PROCEDURE  Do not eat or drink anything after midnight on the night before the procedure or as directed by your health care provider.  Ask your health care provider about:  Changing or stopping your regular medicines.  Taking medicines such as aspirin and ibuprofen. These medicines can thin your blood. Do not take these medicines before your procedure if your health care provider instructs you not to.  Plan to have someone take you home after the procedure. PROCEDURE  You may be given a medicine to help you relax (sedative).  You will be given a medicine to make you sleep (general anesthetic).  Your abdomen will be inflated with a gas. This will make your organs easier to see.  Small incisions will be made in your abdomen.  A laparoscope and other small instruments will be  inserted into the abdomen through the incisions.  A tissue sample may be removed from an organ in the abdomen for examination.  The instruments will be removed from the abdomen.  The gas will be released.  The incisions will be closed with stitches (sutures). AFTER THE PROCEDURE  Your blood pressure, heart rate, breathing rate, and blood oxygen level will be monitored often until the medicines you were given have worn off.   This information is not intended to replace advice given to you by your health care provider. Make sure you discuss any questions you have with your health care provider.   Document Released: 09/12/2000 Document Revised: 02/25/2015 Document Reviewed: 01/17/2014 Elsevier Interactive Patient Education 2016 [employer]. Laparoscopy/Care After Refer to this sheet in the next few weeks. These instructions provide you with information about caring for yourself after your procedure. Your health care provider may also give you more specific instructions. Your treatment has been planned according to current medical practices, but problems sometimes occur. Call your health care provider if you have any problems or questions after your procedure. WHAT TO EXPECT AFTER THE PROCEDURE After your procedure, it is common to have:  Sore throat.  Soreness at the incision site.  Mild cramping.  Tiredness.  Mild nausea or vomiting.  Shoulder pain. HOME CARE INSTRUCTIONS  Rest for the remainder  of the day.  Take medicines only as directed by your health care provider. These include over-the-counter medicines and prescription medicines. Do not take aspirin, which can cause bleeding.  Over the next few days, gradually return to your normal activities and your normal diet.  Avoid sexual intercourse for 2 weeks or as directed by your health care provider.  Do not use tampons, and do not douche.  Do not drive or operate heavy machinery while taking pain medicine.  Do not  lift anything that is heavier than 5 lb (2.3 kg) for 2 weeks or as directed by your health care provider.  Do not take baths. Take showers only. Ask your health care provider when you can start taking baths.  Try to have help for your household needs for the first 7-10 days.  There are many different ways to close and cover an incision, including stitches (sutures), skin glue, and adhesive strips. Follow instructions from your health care provider about:  Incision care.  Bandage (dressing) changes and removal.  Incision closure removal.  Check your incision area every day for signs of infection. Watch for:  Redness, swelling, or pain.  Fluid, blood, or pus.  Keep all follow-up visits as directed by your health care provider. SEEK MEDICAL CARE IF:  You have redness, swelling, or increasing pain in your incision area.  You have fluid, blood, or pus coming from your incision for longer than 1 day.  You notice a bad smell coming from your incision or your dressing.  The edges of your incision break open after the sutures have been removed.  Your pain does not decrease after 2-3 days.  You have a rash.  You repeatedly become dizzy or light-headed.  You have a reaction to your medicine.  Your pain medicine is not helping.  You are constipated. SEEK IMMEDIATE MEDICAL CARE IF:  You have a fever.  You faint.  You have increasing pain in your abdomen.  You have severe pain in one or both of your shoulders.  You have bleeding or drainage from your suture sites or your vagina after surgery.  You have shortness of breath or have difficulty breathing.  You have chest pain or leg pain.  You have ongoing nausea, vomiting, or diarrhea.   This information is not intended to replace advice given to you by your health care provider. Make sure you discuss any questions you have with your health care provider.   Document Released: 12/24/2004 Document Revised: 10/21/2014  Document Reviewed: 09/17/2011 Elsevier Interactive Patient Education 2016 [facility] Instructions  Activity: Get plenty of rest for the remainder of the day. A responsible adult should stay with you for 24 hours following the procedure.  For the next 24 hours, DO NOT: -Drive a car -Paediatric nurse -Drink alcoholic beverages -Take any medication unless instructed by your physician -Make any legal decisions or sign important papers.  Meals: Start with liquid foods such as gelatin or soup. Progress to regular foods as tolerated. Avoid greasy, spicy, heavy foods. If nausea and/or vomiting occur, drink only clear liquids until the nausea and/or vomiting subsides. Call your physician if vomiting continues.  Special Instructions/Symptoms: Your throat may feel dry or sore from the anesthesia or the breathing tube placed in your throat during surgery. If this causes discomfort, gargle with warm salt water. The discomfort should disappear within 24 hours.  If you had a scopolamine patch placed behind your ear for the management of post- operative  of post- operative nausea and/or vomiting: ° °1. The medication in the patch is effective for 72 hours, after which it should be removed.  Wrap patch in a tissue and discard in the trash. Wash hands thoroughly with soap and water. °2. You may remove the patch earlier than 72 hours if you experience unpleasant side effects which may include dry mouth, dizziness or visual disturbances. °3. Avoid touching the patch. Wash your hands with soap and water after contact with the patch. °  ° ° °

## 2015-12-16 ENCOUNTER — Encounter (HOSPITAL_BASED_OUTPATIENT_CLINIC_OR_DEPARTMENT_OTHER): Payer: Self-pay | Admitting: Obstetrics and Gynecology

## 2015-12-18 NOTE — Anesthesia Postprocedure Evaluation (Signed)
Anesthesia Post Note  Patient: Cheryl Lynch  Procedure(s) Performed: Procedure(s) (LRB): IUD REMOVAL; DIAGNOSTIC LAPAROSCOPY; BILATERAL SALPINGECTOMY; DILATATION & CURETTAGE/HYSTEROSCOPY WITH UNSUCCESSFUL NOVASURE ABLATION; HTA ABLATION OF ENDOMETRIUM (N/A)  Patient location during evaluation: PACU Anesthesia Type: General Level of consciousness: awake and alert Pain management: pain level controlled Vital Signs Assessment: post-procedure vital signs reviewed and stable Respiratory status: spontaneous breathing, nonlabored ventilation, respiratory function stable and patient connected to nasal cannula oxygen Cardiovascular status: blood pressure returned to baseline and stable Postop Assessment: no signs of nausea or vomiting Anesthetic complications: no     Last Vitals:  Filed Vitals:   12/15/15 1345 12/15/15 1500  BP: 121/78 124/74  Pulse: 82 80  Temp:  36.3 C  Resp: 17 15    Last Pain:  Filed Vitals:   12/15/15 1513  PainSc: 6    Pain Goal: Patients Stated Pain Goal: 7 (12/15/15 0738)               Tiajuana Amass

## 2016-04-26 ENCOUNTER — Other Ambulatory Visit: Payer: Self-pay | Admitting: Obstetrics and Gynecology

## 2016-04-26 DIAGNOSIS — R921 Mammographic calcification found on diagnostic imaging of breast: Secondary | ICD-10-CM

## 2016-05-19 ENCOUNTER — Ambulatory Visit
Admission: RE | Admit: 2016-05-19 | Discharge: 2016-05-19 | Disposition: A | Discharge status: Home / Self Care | Payer: Commercial Managed Care - HMO | Source: Ambulatory Visit | Attending: Obstetrics and Gynecology | Admitting: Obstetrics and Gynecology

## 2016-05-19 ENCOUNTER — Other Ambulatory Visit: Payer: Self-pay | Admitting: Obstetrics and Gynecology

## 2016-05-19 DIAGNOSIS — R921 Mammographic calcification found on diagnostic imaging of breast: Secondary | ICD-10-CM

## 2017-05-05 ENCOUNTER — Other Ambulatory Visit: Payer: Self-pay | Admitting: Obstetrics and Gynecology

## 2017-05-05 DIAGNOSIS — Z1231 Encounter for screening mammogram for malignant neoplasm of breast: Secondary | ICD-10-CM

## 2017-06-02 ENCOUNTER — Ambulatory Visit: Payer: Commercial Managed Care - HMO

## 2018-11-07 ENCOUNTER — Other Ambulatory Visit: Payer: Self-pay | Admitting: Family Medicine

## 2018-11-07 ENCOUNTER — Ambulatory Visit: Payer: Self-pay

## 2018-11-07 ENCOUNTER — Other Ambulatory Visit: Payer: Self-pay

## 2018-11-07 DIAGNOSIS — Z0000 Encounter for general adult medical examination without abnormal findings: Secondary | ICD-10-CM

## 2019-04-30 ENCOUNTER — Other Ambulatory Visit: Payer: Self-pay

## 2019-04-30 DIAGNOSIS — Z20822 Contact with and (suspected) exposure to covid-19: Secondary | ICD-10-CM

## 2019-05-03 LAB — NOVEL CORONAVIRUS, NAA: SARS-CoV-2, NAA: NOT DETECTED

## 2019-05-07 ENCOUNTER — Other Ambulatory Visit: Payer: Self-pay

## 2019-05-07 DIAGNOSIS — Z20822 Contact with and (suspected) exposure to covid-19: Secondary | ICD-10-CM

## 2019-05-09 LAB — NOVEL CORONAVIRUS, NAA: SARS-CoV-2, NAA: NOT DETECTED

## 2019-06-12 ENCOUNTER — Other Ambulatory Visit: Payer: Self-pay | Admitting: Obstetrics and Gynecology

## 2019-06-12 DIAGNOSIS — R928 Other abnormal and inconclusive findings on diagnostic imaging of breast: Secondary | ICD-10-CM

## 2019-06-28 ENCOUNTER — Other Ambulatory Visit: Payer: Self-pay | Admitting: Obstetrics and Gynecology

## 2019-07-04 ENCOUNTER — Other Ambulatory Visit: Payer: Self-pay

## 2019-07-04 ENCOUNTER — Ambulatory Visit
Admission: RE | Admit: 2019-07-04 | Discharge: 2019-07-04 | Disposition: A | Discharge status: Home / Self Care | Payer: 59 | Source: Ambulatory Visit | Attending: Obstetrics and Gynecology | Admitting: Obstetrics and Gynecology

## 2019-07-04 DIAGNOSIS — R928 Other abnormal and inconclusive findings on diagnostic imaging of breast: Secondary | ICD-10-CM

## 2019-07-23 ENCOUNTER — Other Ambulatory Visit: Payer: Self-pay | Admitting: *Deleted

## 2019-07-23 DIAGNOSIS — Z20822 Contact with and (suspected) exposure to covid-19: Secondary | ICD-10-CM

## 2019-07-24 LAB — NOVEL CORONAVIRUS, NAA: SARS-CoV-2, NAA: NOT DETECTED

## 2019-09-11 ENCOUNTER — Other Ambulatory Visit: Payer: Self-pay | Admitting: Obstetrics and Gynecology

## 2019-11-25 ENCOUNTER — Other Ambulatory Visit: Payer: Self-pay

## 2019-11-25 ENCOUNTER — Other Ambulatory Visit (HOSPITAL_COMMUNITY): Payer: Self-pay | Admitting: Obstetrics & Gynecology

## 2019-11-25 ENCOUNTER — Ambulatory Visit (HOSPITAL_COMMUNITY)
Admission: RE | Admit: 2019-11-25 | Discharge: 2019-11-25 | Disposition: A | Discharge status: Home / Self Care | Payer: 59 | Source: Ambulatory Visit | Attending: Cardiovascular Disease | Admitting: Cardiovascular Disease

## 2019-11-25 DIAGNOSIS — M79604 Pain in right leg: Secondary | ICD-10-CM

## 2019-11-27 ENCOUNTER — Telehealth: Payer: Self-pay

## 2019-11-27 NOTE — Telephone Encounter (Signed)
[facility] [phone], SENT REFERRAL TO [facility]

## 2019-12-25 NOTE — Patient Instructions (Addendum)
DUE TO COVID-19 ONLY ONE VISITOR IS ALLOWED IN WAITING ROOM (VISITOR WILL HAVE A TEMPERATURE CHECK ON ARRIVAL AND MUST WEAR A FACE MASK THE ENTIRE TIME.)  ONCE YOU ARE ADMITTED TO YOUR PRIVATE ROOM, THE SAME ONE VISITOR IS ALLOWED TO VISIT DURING VISITING HOURS ONLY.  Your COVID swab testing is scheduled for: 01/02/20  at 12:30 pm , You must self quarantine after your testing per handout given to you at the testing site.  ([facility] testing enter pre-surgical testing line)    Your procedure is scheduled on: 01/06/20  Report to [facility] AT: 11:00  A. M.   Call this number if you have problems the morning of surgery:[phone].   OUR ADDRESS IS [address].  WE ARE LOCATED IN THE [place]                                   [facility].                                     REMEMBER:   DO NOT EAT FOOD OR DRINK LIQUIDS AFTER MIDNIGHT .    BRUSH YOUR TEETH THE MORNING OF SURGERY.    DO NOT WEAR JEWERLY, MAKE UP, OR NAIL POLISH.  DO NOT WEAR LOTIONS, POWDERS, PERFUMES/COLOGNE OR DEODORANT.  DO NOT SHAVE FOR 24 HOURS PRIOR TO DAY OF SURGERY.  MEN MAY SHAVE FACE AND NECK.  CONTACTS, GLASSES, OR DENTURES MAY NOT BE WORN TO SURGERY.                                    [facility] IS NOT RESPONSIBLE  FOR ANY BELONGINGS.          BRING ALL PRESCRIPTION MEDICATIONS WITH YOU THE DAY OF SURGERY IN ORIGINAL CONTAINERS                                 YOU MAY BRING A SMALL OVERNIGHT BAG .                               [facility] - Preparing for Surgery Before surgery, you can play an important role.  Because skin is not sterile, your skin needs to be as free of germs as possible.  You can reduce the number of germs on your skin by washing with CHG (chlorahexidine gluconate) soap before surgery.  CHG is an antiseptic cleaner which kills germs and bonds with the skin to continue killing germs even after  washing. Please DO NOT use if you have an allergy to CHG or antibacterial soaps.  If your skin becomes reddened/irritated stop using the CHG and inform your nurse when you arrive at Short Stay. Do not shave (including legs and underarms) for at least 48 hours prior to the first CHG shower.  You may shave your face/neck. Please follow these instructions carefully:  1.  Shower with CHG Soap the night before surgery and the  morning of Surgery.  2.  If you choose to wash your hair, wash your hair first as usual with your  normal  shampoo.  3.  After you shampoo, rinse your hair and body thoroughly to remove the  shampoo.                           4.  Use CHG as you would any other liquid soap.  You can apply chg directly  to the skin and wash                       Gently with a scrungie or clean washcloth.  5.  Apply the CHG Soap to your body ONLY FROM THE NECK DOWN.   Do not use on face/ open                           Wound or open sores. Avoid contact with eyes, ears mouth and genitals (private parts).                       Wash face,  Genitals (private parts) with your normal soap.             6.  Wash thoroughly, paying special attention to the area where your surgery  will be performed.  7.  Thoroughly rinse your body with warm water from the neck down.  8.  DO NOT shower/wash with your normal soap after using and rinsing off  the CHG Soap.                9.  Pat yourself dry with a clean towel.            10.  Wear clean pajamas.            11.  Place clean sheets on your bed the night of your first shower and do not  sleep with pets. Day of Surgery : Do not apply any lotions/deodorants the morning of surgery.  Please wear clean clothes to the hospital/surgery center.  FAILURE TO FOLLOW THESE INSTRUCTIONS MAY RESULT IN THE CANCELLATION OF YOUR SURGERY PATIENT SIGNATURE_________________________________  NURSE  SIGNATURE__________________________________  ________________________________________________________________________

## 2019-12-25 NOTE — Progress Notes (Signed)
Pt. Needs order for the upcomming surgery.PST appointment on 12/26/19.Thank you.

## 2019-12-26 ENCOUNTER — Other Ambulatory Visit: Payer: Self-pay

## 2019-12-26 ENCOUNTER — Encounter (HOSPITAL_COMMUNITY)
Admission: RE | Admit: 2019-12-26 | Discharge: 2019-12-26 | Disposition: A | Discharge status: Home / Self Care | Payer: 59 | Source: Ambulatory Visit | Attending: Obstetrics and Gynecology | Admitting: Obstetrics and Gynecology

## 2019-12-26 ENCOUNTER — Encounter (HOSPITAL_COMMUNITY): Payer: Self-pay

## 2019-12-26 DIAGNOSIS — Z01812 Encounter for preprocedural laboratory examination: Secondary | ICD-10-CM | POA: Insufficient documentation

## 2019-12-26 LAB — CBC
HCT: 42 % (ref 36.0–46.0)
Hemoglobin: 13.4 g/dL (ref 12.0–15.0)
MCH: 28.8 pg (ref 26.0–34.0)
MCHC: 31.9 g/dL (ref 30.0–36.0)
MCV: 90.3 fL (ref 80.0–100.0)
Platelets: 300 10*3/uL (ref 150–400)
RBC: 4.65 MIL/uL (ref 3.87–5.11)
RDW: 13.9 % (ref 11.5–15.5)
WBC: 10.1 10*3/uL (ref 4.0–10.5)
nRBC: 0 % (ref 0.0–0.2)

## 2019-12-26 NOTE — Progress Notes (Signed)
COVID Vaccine Completed:yes Date COVID Vaccine completed:09/23/19 COVID vaccine manufacturer: *Pfizer    [employer]   PCP - Dr. Willey Blade. Cardiologist -   Chest x-ray -  EKG -  Stress Test -  ECHO -  Cardiac Cath -   Sleep Study -  CPAP -   Fasting Blood Sugar -  Checks Blood Sugar _____ times a day  Blood Thinner Instructions: Aspirin Instructions: Last Dose:  Anesthesia review:   Patient denies shortness of breath, fever, cough and chest pain at PAT appointment   Patient verbalized understanding of instructions that were given to them at the PAT appointment. Patient was also instructed that they will need to review over the PAT instructions again at home before surgery.

## 2019-12-27 LAB — TYPE AND SCREEN
ABO/RH(D): O POS
Antibody Screen: NEGATIVE

## 2019-12-27 NOTE — H&P (Signed)
Cheryl Lynch is a 49 y.o. female, 49 YO female, P: 2-0-1-2 presents for hysterectomy because of abnormal vaginal bleeding, menorrhagia and uterine fibroids. The patient underwent a Hydrothermal Endometrial Ablation in 2017 for menorrhagia and had good control until January 2021 when she began to have bleeding that would last 2-3 weeks at a time with pad change every 2 hours and cramping rated 10/10.  She achieved some relief from her cramping with Ibuprofen 800 mg  and transient relief from her bleeding with oral contraceptive.  While taking oral contraceptives the patient had no bleeding but during her placebo week her bleeding started and continued as it had before this regimen was initiated.  Additionally the patient developed elevated liver enzymes with oral contraceptives so had to discontinue.  A pelvic ultrasound in March 2021 showed a retroverted uterus (6.9 cm from fundus to external os) 5.05 x 3.87 c 4.01 cm, endometrium: 2.20 mm; #3 intramural fibroids: left anterior fundal-1.47 cm, right anterior fundal-0.76 cm and fundal-0.66 cm; right ovary- 2.37 cm and left ovary-2.27 cm.  An endometrial biopsy at that same time did not show any malignancy nor atypia.  Her  TSH and Hemoglobin labs were also normal.  She denies any changes in her bowel or bladder function, pelvic pain  nor vaginitis symptoms. She admits to some occasional dyspareunia..  A review of both medical and surgical management options were given to the patient however she has decided to proceed with definitive therapy in the form of hysterectomy.   Past Medical History  OB History: G: 3;  P: 2-0-1-2; 2003  SVB and  2008-C-section   GYN History: menarche: 49 YO;    LMP:12-20-2019;   Contraception: Tubal Sterilization;  Has a history of HSV-2 but  denies history of abnormal PAP smear.  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 Latex Itching  . Sulfamethoxazole Itching    ROS: Admits to glasses/contact lenses, menstrual headaches  but vision changes, nasal congestion, dysphagia, tinnitus, dizziness, hoarseness, cough,  chest pain, shortness of breath, nausea, vomiting, diarrhea,constipation,  urinary frequency, urgency  dysuria, hematuria, vaginitis symptoms, pelvic pain, swelling of joints,easy bruising,  myalgias, arthralgias, skin rashes, unexplained weight loss and except as is mentioned in the history of present illness, patient's review of systems is otherwise negative.     Physical Exam Bp:  140/88       P:  95 bpm     R:   16       Weight: 203 lbs.  Height:  5'8"          BMI: 30.9   Neck: supple without masses or thyromegaly Lungs: clear to auscultation Heart: regular rate and rhythm Abdomen: soft, non-tender and no organomegaly Pelvic:EGBUS- wnl; vagina-normal rugae; uterus-normal size, cervix without lesions or motion tenderness; adnexae-no tenderness or masses Extremities:  no clubbing, cyanosis or edema   Assesment: Abnormal Uterine Bleeding  Uterine Fibroids                       S/P Endometrial Ablation   Disposition:  A discussion was held with patient regarding the indication for her procedure(s) along with the risks, which include but are not  limited to: reaction to anesthesia, damage to adjacent organs, infection and excessive bleeding. The patient verbalized understanding of  these risks and has consented to proceed with a  Laparoscopically Assisted Vaginal Hysterectomy, Possible Bilateral Salpingectomy and Cystoscopy at [facility] on January 06, 2020 @ 1 p.m.  CSN# [id number]   Mark[place] Mowatt J. Florene Glen, PA-C  for Dr. Franklyn Lor. Dillard

## 2020-01-02 ENCOUNTER — Other Ambulatory Visit (HOSPITAL_COMMUNITY)
Admission: RE | Admit: 2020-01-02 | Discharge: 2020-01-02 | Disposition: A | Discharge status: Home / Self Care | Payer: 59 | Source: Ambulatory Visit | Attending: Obstetrics and Gynecology | Admitting: Obstetrics and Gynecology

## 2020-01-02 DIAGNOSIS — Z20822 Contact with and (suspected) exposure to covid-19: Secondary | ICD-10-CM | POA: Insufficient documentation

## 2020-01-02 DIAGNOSIS — Z01812 Encounter for preprocedural laboratory examination: Secondary | ICD-10-CM | POA: Diagnosis not present

## 2020-01-02 LAB — SARS CORONAVIRUS 2 (TAT 6-24 HRS): SARS Coronavirus 2: NEGATIVE

## 2020-01-03 ENCOUNTER — Other Ambulatory Visit (HOSPITAL_COMMUNITY): Payer: 59

## 2020-01-06 ENCOUNTER — Ambulatory Visit (HOSPITAL_BASED_OUTPATIENT_CLINIC_OR_DEPARTMENT_OTHER)
Admission: RE | Admit: 2020-01-06 | Discharge: 2020-01-07 | Disposition: A | Discharge status: Home / Self Care | Payer: 59 | Attending: Obstetrics and Gynecology | Admitting: Obstetrics and Gynecology

## 2020-01-06 ENCOUNTER — Other Ambulatory Visit: Payer: Self-pay | Admitting: Obstetrics and Gynecology

## 2020-01-06 ENCOUNTER — Ambulatory Visit (HOSPITAL_BASED_OUTPATIENT_CLINIC_OR_DEPARTMENT_OTHER): Payer: 59 | Admitting: Anesthesiology

## 2020-01-06 ENCOUNTER — Encounter (HOSPITAL_BASED_OUTPATIENT_CLINIC_OR_DEPARTMENT_OTHER)
Admission: RE | Disposition: A | Discharge status: Home / Self Care | Payer: Self-pay | Source: Home / Self Care | Attending: Obstetrics and Gynecology

## 2020-01-06 ENCOUNTER — Encounter (HOSPITAL_BASED_OUTPATIENT_CLINIC_OR_DEPARTMENT_OTHER): Payer: Self-pay | Admitting: Obstetrics and Gynecology

## 2020-01-06 ENCOUNTER — Ambulatory Visit (HOSPITAL_COMMUNITY): Payer: 59

## 2020-01-06 DIAGNOSIS — N920 Excessive and frequent menstruation with regular cycle: Secondary | ICD-10-CM | POA: Diagnosis present

## 2020-01-06 DIAGNOSIS — D259 Leiomyoma of uterus, unspecified: Secondary | ICD-10-CM | POA: Diagnosis not present

## 2020-01-06 DIAGNOSIS — J45909 Unspecified asthma, uncomplicated: Secondary | ICD-10-CM | POA: Insufficient documentation

## 2020-01-06 DIAGNOSIS — F329 Major depressive disorder, single episode, unspecified: Secondary | ICD-10-CM | POA: Insufficient documentation

## 2020-01-06 DIAGNOSIS — M1990 Unspecified osteoarthritis, unspecified site: Secondary | ICD-10-CM | POA: Diagnosis not present

## 2020-01-06 HISTORY — PX: CYSTOSCOPY: SHX5120

## 2020-01-06 HISTORY — PX: LAPAROSCOPIC VAGINAL HYSTERECTOMY WITH SALPINGECTOMY: SHX6680

## 2020-01-06 LAB — ABO/RH: ABO/RH(D): O POS

## 2020-01-06 LAB — POCT PREGNANCY, URINE: Preg Test, Ur: NEGATIVE

## 2020-01-06 SURGERY — HYSTERECTOMY, VAGINAL, LAPAROSCOPY-ASSISTED, WITH SALPINGECTOMY
Anesthesia: General | Site: Bladder

## 2020-01-06 MED ORDER — HYDROMORPHONE HCL 1 MG/ML IJ SOLN
1.0000 mg | INTRAMUSCULAR | Status: DC | PRN
  Administered 2020-01-06: 1 mg via INTRAVENOUS

## 2020-01-06 MED ORDER — ONDANSETRON HCL 4 MG/2ML IJ SOLN
INTRAMUSCULAR | Status: DC | PRN
  Administered 2020-01-06: 4 mg via INTRAVENOUS

## 2020-01-06 MED ORDER — ACETAMINOPHEN 500 MG PO TABS
1000.0000 mg | ORAL_TABLET | Freq: Once | ORAL | Status: AC
  Administered 2020-01-06: 1000 mg via ORAL

## 2020-01-06 MED ORDER — PANTOPRAZOLE SODIUM 40 MG PO TBEC
40.0000 mg | DELAYED_RELEASE_TABLET | Freq: Every day | ORAL | Status: DC
  Administered 2020-01-06: 40 mg via ORAL

## 2020-01-06 MED ORDER — ORAL CARE MOUTH RINSE: 15.0000 mL | Freq: Once | OROMUCOSAL | Status: DC

## 2020-01-06 MED ORDER — SIMETHICONE 80 MG PO CHEW
80.0000 mg | CHEWABLE_TABLET | Freq: Four times a day (QID) | ORAL | Status: DC | PRN
  Administered 2020-01-06: 80 mg via ORAL

## 2020-01-06 MED ORDER — BUPIVACAINE HCL (PF) 0.25 % IJ SOLN
INTRAMUSCULAR | Status: DC | PRN
  Administered 2020-01-06: 7 mL

## 2020-01-06 MED ORDER — SODIUM CHLORIDE 0.9 % IR SOLN
Status: DC | PRN
  Administered 2020-01-06: 1000 mL via INTRAVESICAL
  Administered 2020-01-06: 3000 mL

## 2020-01-06 MED ORDER — DEXAMETHASONE SODIUM PHOSPHATE 10 MG/ML IJ SOLN
INTRAMUSCULAR | Status: AC
  Filled 2020-01-06: qty 1

## 2020-01-06 MED ORDER — PROPOFOL 10 MG/ML IV BOLUS
INTRAVENOUS | Status: AC
  Filled 2020-01-06: qty 20

## 2020-01-06 MED ORDER — SCOPOLAMINE 1 MG/3DAYS TD PT72
MEDICATED_PATCH | TRANSDERMAL | Status: AC
  Filled 2020-01-06: qty 1

## 2020-01-06 MED ORDER — FENTANYL CITRATE (PF) 100 MCG/2ML IJ SOLN: 25.0000 ug | INTRAMUSCULAR | Status: DC | PRN

## 2020-01-06 MED ORDER — CEFAZOLIN SODIUM-DEXTROSE 2-4 GM/100ML-% IV SOLN
2.0000 g | INTRAVENOUS | Status: AC
  Administered 2020-01-06: 2 g via INTRAVENOUS

## 2020-01-06 MED ORDER — CEFAZOLIN SODIUM-DEXTROSE 2-4 GM/100ML-% IV SOLN
INTRAVENOUS | Status: AC
  Filled 2020-01-06: qty 100

## 2020-01-06 MED ORDER — FENTANYL CITRATE (PF) 100 MCG/2ML IJ SOLN
INTRAMUSCULAR | Status: DC | PRN
  Administered 2020-01-06: 100 ug via INTRAVENOUS
  Administered 2020-01-06: 50 ug via INTRAVENOUS

## 2020-01-06 MED ORDER — ROCURONIUM BROMIDE 10 MG/ML (PF) SYRINGE
PREFILLED_SYRINGE | INTRAVENOUS | Status: AC
  Filled 2020-01-06: qty 10

## 2020-01-06 MED ORDER — PANTOPRAZOLE SODIUM 40 MG PO TBEC
DELAYED_RELEASE_TABLET | ORAL | Status: AC
  Filled 2020-01-06: qty 1

## 2020-01-06 MED ORDER — KETOROLAC TROMETHAMINE 30 MG/ML IJ SOLN
INTRAMUSCULAR | Status: AC
  Filled 2020-01-06: qty 1

## 2020-01-06 MED ORDER — ONDANSETRON HCL 4 MG/2ML IJ SOLN
INTRAMUSCULAR | Status: AC
  Filled 2020-01-06: qty 2

## 2020-01-06 MED ORDER — MIDAZOLAM HCL 2 MG/2ML IJ SOLN
INTRAMUSCULAR | Status: AC
  Filled 2020-01-06: qty 2

## 2020-01-06 MED ORDER — OXYCODONE-ACETAMINOPHEN 5-325 MG PO TABS
ORAL_TABLET | ORAL | Status: AC
  Filled 2020-01-06: qty 1

## 2020-01-06 MED ORDER — LACTATED RINGERS IV SOLN: INTRAVENOUS | Status: DC

## 2020-01-06 MED ORDER — DOCUSATE SODIUM 100 MG PO CAPS
ORAL_CAPSULE | ORAL | Status: AC
  Filled 2020-01-06: qty 1

## 2020-01-06 MED ORDER — DOCUSATE SODIUM 100 MG PO CAPS
100.0000 mg | ORAL_CAPSULE | Freq: Two times a day (BID) | ORAL | Status: DC
  Administered 2020-01-06: 100 mg via ORAL

## 2020-01-06 MED ORDER — ONDANSETRON HCL 4 MG PO TABS: 4.0000 mg | ORAL_TABLET | Freq: Four times a day (QID) | ORAL | Status: DC | PRN

## 2020-01-06 MED ORDER — FLUORESCEIN SODIUM 10 % IV SOLN
INTRAVENOUS | Status: DC | PRN
  Administered 2020-01-06: .5 mL via INTRAVENOUS

## 2020-01-06 MED ORDER — HYDROMORPHONE HCL 1 MG/ML IJ SOLN
INTRAMUSCULAR | Status: AC
  Filled 2020-01-06: qty 1

## 2020-01-06 MED ORDER — LIDOCAINE 2% (20 MG/ML) 5 ML SYRINGE
INTRAMUSCULAR | Status: DC | PRN
  Administered 2020-01-06: 100 mg via INTRAVENOUS

## 2020-01-06 MED ORDER — AMISULPRIDE (ANTIEMETIC) 5 MG/2ML IV SOLN: 10.0000 mg | Freq: Once | INTRAVENOUS | Status: DC | PRN

## 2020-01-06 MED ORDER — ONDANSETRON HCL 4 MG/2ML IJ SOLN
4.0000 mg | Freq: Four times a day (QID) | INTRAMUSCULAR | Status: DC | PRN
  Administered 2020-01-06: 4 mg via INTRAVENOUS

## 2020-01-06 MED ORDER — ROCURONIUM BROMIDE 10 MG/ML (PF) SYRINGE
PREFILLED_SYRINGE | INTRAVENOUS | Status: DC | PRN
  Administered 2020-01-06: 50 mg via INTRAVENOUS
  Administered 2020-01-06: 20 mg via INTRAVENOUS

## 2020-01-06 MED ORDER — LIDOCAINE 2% (20 MG/ML) 5 ML SYRINGE
INTRAMUSCULAR | Status: AC
  Filled 2020-01-06: qty 5

## 2020-01-06 MED ORDER — ESTRADIOL 0.1 MG/GM VA CREA
TOPICAL_CREAM | VAGINAL | Status: DC | PRN
  Administered 2020-01-06: 1 via VAGINAL

## 2020-01-06 MED ORDER — SODIUM CHLORIDE 0.9 % IV SOLN
INTRAVENOUS | Status: DC | PRN
  Administered 2020-01-06: 10 mL

## 2020-01-06 MED ORDER — MIDAZOLAM HCL 5 MG/5ML IJ SOLN
INTRAMUSCULAR | Status: DC | PRN
  Administered 2020-01-06: 2 mg via INTRAVENOUS

## 2020-01-06 MED ORDER — KETOROLAC TROMETHAMINE 30 MG/ML IJ SOLN
30.0000 mg | Freq: Four times a day (QID) | INTRAMUSCULAR | Status: DC
  Administered 2020-01-06 – 2020-01-07 (×2): 30 mg via INTRAVENOUS

## 2020-01-06 MED ORDER — SIMETHICONE 80 MG PO CHEW
CHEWABLE_TABLET | ORAL | Status: AC
  Filled 2020-01-06: qty 1

## 2020-01-06 MED ORDER — POVIDONE-IODINE 10 % EX SWAB: 2.0000 "application " | Freq: Once | CUTANEOUS | Status: DC

## 2020-01-06 MED ORDER — CIPROFLOXACIN HCL 500 MG PO TABS
250.0000 mg | ORAL_TABLET | Freq: Two times a day (BID) | ORAL | Status: DC
  Administered 2020-01-07: 250 mg via ORAL

## 2020-01-06 MED ORDER — VASOPRESSIN 20 UNIT/ML IV SOLN
INTRAVENOUS | Status: DC | PRN
  Administered 2020-01-06: 20 mL via INTRAMUSCULAR

## 2020-01-06 MED ORDER — PROPOFOL 10 MG/ML IV BOLUS
INTRAVENOUS | Status: DC | PRN
  Administered 2020-01-06: 180 mg via INTRAVENOUS

## 2020-01-06 MED ORDER — DEXAMETHASONE SODIUM PHOSPHATE 10 MG/ML IJ SOLN
INTRAMUSCULAR | Status: DC | PRN
  Administered 2020-01-06: 10 mg via INTRAVENOUS

## 2020-01-06 MED ORDER — OXYCODONE-ACETAMINOPHEN 5-325 MG PO TABS
1.0000 | ORAL_TABLET | Freq: Four times a day (QID) | ORAL | Status: DC
  Administered 2020-01-06 (×2): 1 via ORAL

## 2020-01-06 MED ORDER — FLUORESCEIN SODIUM 10 % IV SOLN
INTRAVENOUS | Status: AC
  Filled 2020-01-06: qty 5

## 2020-01-06 MED ORDER — SCOPOLAMINE 1 MG/3DAYS TD PT72
1.0000 | MEDICATED_PATCH | TRANSDERMAL | Status: DC
  Administered 2020-01-06: 1.5 mg via TRANSDERMAL

## 2020-01-06 MED ORDER — KETOROLAC TROMETHAMINE 30 MG/ML IJ SOLN
INTRAMUSCULAR | Status: DC | PRN
  Administered 2020-01-06: 30 mg via INTRAVENOUS

## 2020-01-06 MED ORDER — FENTANYL CITRATE (PF) 250 MCG/5ML IJ SOLN
INTRAMUSCULAR | Status: AC
  Filled 2020-01-06: qty 5

## 2020-01-06 MED ORDER — ACETAMINOPHEN 500 MG PO TABS
ORAL_TABLET | ORAL | Status: AC
  Filled 2020-01-06: qty 2

## 2020-01-06 MED ORDER — MENTHOL 3 MG MT LOZG: 1.0000 | LOZENGE | OROMUCOSAL | Status: DC | PRN

## 2020-01-06 MED ORDER — CHLORHEXIDINE GLUCONATE 0.12 % MT SOLN: 15.0000 mL | Freq: Once | OROMUCOSAL | Status: DC

## 2020-01-06 SURGICAL SUPPLY — 60 items
APPLICATOR COTTON TIP 6 STRL (MISCELLANEOUS) ×2 IMPLANT
APPLICATOR COTTON TIP 6IN STRL (MISCELLANEOUS) ×4
CABLE HIGH FREQUENCY MONO STRZ (ELECTRODE) IMPLANT
CATH URET 5FR 28IN OPEN ENDED (CATHETERS) ×4 IMPLANT
COVER BACK TABLE 60X90IN (DRAPES) ×4 IMPLANT
COVER MAYO STAND STRL (DRAPES) ×4 IMPLANT
DECANTER SPIKE VIAL GLASS SM (MISCELLANEOUS) IMPLANT
DERMABOND ADVANCED (GAUZE/BANDAGES/DRESSINGS) ×2
DERMABOND ADVANCED .7 DNX12 (GAUZE/BANDAGES/DRESSINGS) ×2 IMPLANT
DRAPE SHEET LG 3/4 BI-LAMINATE (DRAPES) ×8 IMPLANT
DRAPE STERI URO 9X17 APER PCH (DRAPES) ×4 IMPLANT
DRSG OPSITE POSTOP 3X4 (GAUZE/BANDAGES/DRESSINGS) ×4 IMPLANT
DURAPREP 26ML APPLICATOR (WOUND CARE) ×4 IMPLANT
ELECT REM PT RETURN 9FT ADLT (ELECTROSURGICAL)
ELECTRODE REM PT RTRN 9FT ADLT (ELECTROSURGICAL) IMPLANT
FORCEPS CUTTING 33CM 5MM (CUTTING FORCEPS) IMPLANT
GAUZE 4X4 16PLY RFD (DISPOSABLE) ×4 IMPLANT
GAUZE PACKING 2X5 YD STRL (GAUZE/BANDAGES/DRESSINGS) IMPLANT
GAUZE VASELINE 3X9 (GAUZE/BANDAGES/DRESSINGS) IMPLANT
GLOVE BIO SURGEON STRL SZ 6.5 (GLOVE) IMPLANT
GLOVE BIO SURGEONS STRL SZ 6.5 (GLOVE)
GLOVE BIOGEL PI IND STRL 6.5 (GLOVE) ×2 IMPLANT
GLOVE BIOGEL PI IND STRL 7.0 (GLOVE) ×8 IMPLANT
GLOVE BIOGEL PI INDICATOR 6.5 (GLOVE) ×2
GLOVE BIOGEL PI INDICATOR 7.0 (GLOVE) ×8
GLOVE SURG SS PI 6.5 STRL IVOR (GLOVE) ×16 IMPLANT
GLOVE SURG SS PI 7.0 STRL IVOR (GLOVE) ×12 IMPLANT
GUIDEWIRE STR DUAL SENSOR (WIRE) ×4 IMPLANT
KIT TURNOVER CYSTO (KITS) ×4 IMPLANT
LEGGING LITHOTOMY PAIR STRL (DRAPES) ×4 IMPLANT
NEEDLE HYPO 21X1 ECLIPSE (NEEDLE) ×4 IMPLANT
NEEDLE MAYO CATGUT SZ4 (NEEDLE) ×4 IMPLANT
NS IRRIG 1000ML POUR BTL (IV SOLUTION) ×4 IMPLANT
PACK LAVH (CUSTOM PROCEDURE TRAY) ×4 IMPLANT
PACK ROBOTIC GOWN (GOWN DISPOSABLE) ×4 IMPLANT
PACK TRENDGUARD 450 HYBRID PRO (MISCELLANEOUS) IMPLANT
PAD OB MATERNITY 4.3X12.25 (PERSONAL CARE ITEMS) ×4 IMPLANT
PROTECTOR NERVE ULNAR (MISCELLANEOUS) ×8 IMPLANT
SET IRRIG Y TYPE TUR BLADDER L (SET/KITS/TRAYS/PACK) ×4 IMPLANT
SET SUCTION IRRIG HYDROSURG (IRRIGATION / IRRIGATOR) IMPLANT
SET TUBE SMOKE EVAC HIGH FLOW (TUBING) ×4 IMPLANT
SHEARS HARMONIC ACE PLUS 36CM (ENDOMECHANICALS) IMPLANT
SOLUTION ELECTROLUBE (MISCELLANEOUS) IMPLANT
SUT CHROMIC 0 CT 1 (SUTURE) ×8 IMPLANT
SUT CHROMIC 0 UR 5 27 (SUTURE) IMPLANT
SUT MNCRL AB 3-0 PS2 27 (SUTURE) ×8 IMPLANT
SUT VIC AB 0 CT1 18XCR BRD8 (SUTURE) ×6 IMPLANT
SUT VIC AB 0 CT1 27 (SUTURE) ×4
SUT VIC AB 0 CT1 27XBRD ANBCTR (SUTURE) ×2 IMPLANT
SUT VIC AB 0 CT1 36 (SUTURE) ×4 IMPLANT
SUT VIC AB 0 CT1 8-18 (SUTURE) ×12
SUT VICRYL 0 TIES 12 18 (SUTURE) ×4 IMPLANT
SUT VICRYL 0 UR6 27IN ABS (SUTURE) ×4 IMPLANT
SYR 50ML LL SCALE MARK (SYRINGE) IMPLANT
SYR BULB IRRIG 60ML STRL (SYRINGE) ×4 IMPLANT
TRAY FOLEY W/BAG SLVR 14FR (SET/KITS/TRAYS/PACK) ×4 IMPLANT
TRENDGUARD 450 HYBRID PRO PACK (MISCELLANEOUS)
TROCAR BALLN 12MMX100 BLUNT (TROCAR) ×4 IMPLANT
TROCAR BLADELESS OPT 5 100 (ENDOMECHANICALS) ×8 IMPLANT
WARMER LAPAROSCOPE (MISCELLANEOUS) ×4 IMPLANT

## 2020-01-06 NOTE — Anesthesia Postprocedure Evaluation (Signed)
Anesthesia Post Note  Patient: Carney Corners  Procedure(s) Performed: LAPAROSCOPIC ASSISTED VAGINAL HYSTERECTOMY, REMOVAL OF UMBILICAL SCARE (N/A Abdomen) CYSTOSCOPY WITH RIGHT RETROGRADE PYELOGRAM (N/A Bladder)     Patient location during evaluation: PACU Anesthesia Type: General Level of consciousness: awake and alert Pain management: pain level controlled Vital Signs Assessment: post-procedure vital signs reviewed and stable Respiratory status: spontaneous breathing, nonlabored ventilation, respiratory function stable and patient connected to nasal cannula oxygen Cardiovascular status: blood pressure returned to baseline and stable Postop Assessment: no apparent nausea or vomiting Anesthetic complications: no   No complications documented.  Last Vitals:  Vitals:   01/06/20 1712 01/06/20 1735  BP: 132/84 130/85  Pulse: 92 90  Resp: 18   Temp: 36.5 C   SpO2: 95% 95%    Last Pain:  Vitals:   01/06/20 1712  TempSrc:   PainSc: 8                  Jeshua Ransford DAVID

## 2020-01-06 NOTE — Anesthesia Procedure Notes (Signed)
Procedure Name: Intubation Date/Time: 01/06/2020 1:29 PM Performed by: Bonney Aid, CRNA Pre-anesthesia Checklist: Patient identified, Emergency Drugs available, Suction available and Patient being monitored Patient Re-evaluated:Patient Re-evaluated prior to induction Oxygen Delivery Method: Circle system utilized Preoxygenation: Pre-oxygenation with 100% oxygen Induction Type: IV induction Ventilation: Mask ventilation without difficulty Laryngoscope Size: Mac and 3 Grade View: Grade I Tube type: Oral Tube size: 7.5 mm Number of attempts: 1 Airway Equipment and Method: Stylet Placement Confirmation: ETT inserted through vocal cords under direct vision,  positive ETCO2 and breath sounds checked- equal and bilateral Secured at: 22 cm Tube secured with: Tape Dental Injury: Teeth and Oropharynx as per pre-operative assessment

## 2020-01-06 NOTE — Transfer of Care (Signed)
Immediate Anesthesia Transfer of Care Note  Patient: Cheryl Lynch  Procedure(s) Performed: LAPAROSCOPIC ASSISTED VAGINAL HYSTERECTOMY WITH BILATERAL SALPINGECTOMY REMOVAL OF UMBILICAL SCARE (N/A ) CYSTOSCOPY WITH RIGHT RETROGRADE PYELOGRAM (N/A )  Patient Location: PACU  Anesthesia Type:General  Level of Consciousness: drowsy  Airway & Oxygen Therapy: Patient Spontanous Breathing and Patient connected to nasal cannula oxygen  Post-op Assessment: Report given to RN  Post vital signs: Reviewed and stable  Last Vitals:  Vitals Value Taken Time  BP 130/91 01/06/20 1618  Temp 36.5 C 01/06/20 1618  Pulse 88 01/06/20 1619  Resp 21 01/06/20 1619  SpO2 99 % 01/06/20 1619  Vitals shown include unvalidated device data.  Last Pain:  Vitals:   01/06/20 1144  TempSrc: Oral  PainSc: 0-No pain      Patients Stated Pain Goal: 5 (54/65/68 1275)  Complications: No complications documented.

## 2020-01-06 NOTE — Anesthesia Preprocedure Evaluation (Signed)
Anesthesia Evaluation  Patient identified by MRN, date of birth, ID band Patient awake    Reviewed: Allergy & Precautions, NPO status , Patient's Chart, lab work & pertinent test results  Airway Mallampati: II  TM Distance: >3 FB Neck ROM: Full    Dental   Pulmonary asthma ,    breath sounds clear to auscultation       Cardiovascular negative cardio ROS   Rhythm:Regular Rate:Normal     Neuro/Psych negative neurological ROS     GI/Hepatic negative GI ROS, Neg liver ROS,   Endo/Other  negative endocrine ROS  Renal/GU negative Renal ROS     Musculoskeletal  (+) Arthritis ,   Abdominal   Peds  Hematology negative hematology ROS (+)   Anesthesia Other Findings   Reproductive/Obstetrics                             Anesthesia Physical Anesthesia Plan  ASA: II  Anesthesia Plan: General   Post-op Pain Management:    Induction: Intravenous  PONV Risk Score and Plan: 4 or greater and Midazolam, Dexamethasone, Ondansetron, Treatment may vary due to age or medical condition and Scopolamine patch - Pre-op  Airway Management Planned: Oral ETT  Additional Equipment: None  Intra-op Plan:   Post-operative Plan: Extubation in OR  Informed Consent: I have reviewed the patients History and Physical, chart, labs and discussed the procedure including the risks, benefits and alternatives for the proposed anesthesia with the patient or authorized representative who has indicated his/her understanding and acceptance.     Dental advisory given  Plan Discussed with: CRNA  Anesthesia Plan Comments:         Anesthesia Quick Evaluation

## 2020-01-06 NOTE — Op Note (Signed)
Preoperative diagnosis:  1. Uterine fibroids  Postoperative diagnosis:  1. Same  Procedure:  1. Cystoscopy 2. right retrograde pyelography with interpretation   Surgeon: Jacalyn Lefevre, MD  Anesthesia: General  Complications: None  Intraoperative findings:  right retrograde pyelography demonstrated normal course of ureter without filling defect, extravasation or other abnormalities.  EBL: Minimal  Specimens: None  Indication: Cheryl Lynch is a 49 y.o. patient with with uterine fibroids undergoing hysterectomy with Dr. Crawford Givens.  Urology was consulted after intraoperative cystoscopy showed poor efflux from right ureteral orifice.  Description of procedure:  The patient was already intubated and prepped and draped when Urology was consulted.     Cystourethroscopy was performed.  The patient's urethra was examined and was normal. The bladder was then systematically examined in its entirety. There was no evidence for any bladder tumors, stones, or other mucosal pathology.    Attention then turned to the right ureteral orifice and a sensor wire was used to cannulate right ureteral orifice.  Next, the ureteral catheter was advanced over the wire and the wire was removed. Omnipaque contrast was injected through the ureteral catheter and a retrograde pyelogram was performed with findings as dictated above.  The ureteral catheter was removed.  The bladder was then emptied and the procedure ended.  The patient appeared to tolerate the procedure well and without complications. Care was transferred back to Dr. Charlesetta Lynch.   Jacalyn Lefevre, M.D.

## 2020-01-06 NOTE — Op Note (Signed)
preop Diagnosis: Symptomatic Fibroids, Menometrorrhagia  Postop Diagnosis: same with umbilical mass   Procedure: LAVH, , Cystoscopy, Retrograde pyelogram and cysto by Dr Claudia Desanctis  Anesthesia: General   Anesthesiologist: see record  Attending: Betsy Coder, MD   Assistant: Earnstine Regal PA  Findings small uterus with mild adhesions of the ovary to the uterine wall.  Threasa Heads hugh curtis seen.  Normal appearing appendix  Pathology: uterus and cervix and bilateral tubes  Fluids: 2000 cccrystalloid  UOP: 200cc  EBL: 275TZ  Complications:none  Procedure: The patient was taken to the operating room, placed under general anesthesia and prepped and draped in the normal sterile fashion. A Foley catheter was placed in the bladde. A weighted speculum and vaginal retractors were placed in the vagina. Tenaculum was placed on the anterior lip of the cervix.  A hulka manipulator was placed in the uterus. Attention was then turned to the abdomen. A 10 mm infraumbilical incision was made with the scalpel after 5 cc of 25% percent Marcaine was used for local anesthesia. The subcutaneous tissue was dissected and the fascia was incised with the knife. There was a small calcifies mass in the umbilicus which was removed and sent to pathology.   A purse string stitch was placed in the fascia and Hassan placed into the intra-abdominal cavity and anchored to the suture. Intraabdominal placement was confirmed with the laparoscope.  Two 5 mm trochars were placed in the right and left lower quadrants under direct visualization with the laparoscope. Using the operative scope, scissors and kleppengers the adhesions were cauterized and cut. These adhesions were extending from the omentum and uterus to the anterior abdominal wall.    .   Both round ligaments were cauterized and cut with the gyrus bipolar cautery as well and the bladder flap created with the tripolar and water dissection.  and removed away from the uterus   The left ovary was adherent to the uterus.  The adhesions .  The left uterine ovarian ligament was then cauterized and cut. .  The right utero-ovarian ligament was cauterized and cut with the tripolar cautery gyrus.   Attention was then turned to the vagina.  A weighted speculum was placed in the posterior fourchette.  petrussin mixture was placed circumferentially around the cervix.  With blunt and sharp dissection the cervix was dissected away from the bowel and bladder.  Both uterosacral ligaments were clamped, cut and suture ligated and held.  The anterior and posterior culdesac was entered sharply using metzenbaum scissors.  The cardinal ligaments and  The uterine arteries were clamped, cut and suture ligated bilaterally.    The uterus was delivered.  .  The mcall suture was placed.   The vaginal cuff was closed with interrupted suture of 0 chromicl.   the patient was given fluroscein.  .  Cystoscopy was performed the left  ureter were seen to efflux fluroscein without difficulty. The bladder had full integrity with no suture or laceration visualized. The right ureter did not efflux any fluroscein for five minutes.  Dr Claudia Desanctis was called and preformed a cysto with retrograde pyelogram on the right ureter and it was normal.    The vagina was inspected and the cuff was noted to be intact.  Attention was then turned back to the abdomen after removing top pair of gloves. The abdomen was reinsufflated with CO2 gas.   The abdomen and pelvis was copiously irrigated.  .     hemostasis was noted.  A five  scope looked at the abdomen and the baloon was clear of all adhesions.      All trochars were removed under direct visualization using the laparoscope.  The umbilical fascia was reapproximated by tying the circumferential suture. The two 5 mm incisions were closed with 3-0 Monocryl via a subcuticular stitch.  All remaining skin incisions were closed with Dermabond and the 10 mm skin incisions were reinforced using  Dermabond.  Sponge lap and needle counts were correct.  The patient tolerated the procedure well and was returned to the PACU in stable condition

## 2020-01-07 ENCOUNTER — Encounter (HOSPITAL_BASED_OUTPATIENT_CLINIC_OR_DEPARTMENT_OTHER): Payer: Self-pay | Admitting: Obstetrics and Gynecology

## 2020-01-07 DIAGNOSIS — N920 Excessive and frequent menstruation with regular cycle: Secondary | ICD-10-CM | POA: Diagnosis not present

## 2020-01-07 LAB — CBC
HCT: 36.4 % (ref 36.0–46.0)
Hemoglobin: 12.1 g/dL (ref 12.0–15.0)
MCH: 29.1 pg (ref 26.0–34.0)
MCHC: 33.2 g/dL (ref 30.0–36.0)
MCV: 87.5 fL (ref 80.0–100.0)
Platelets: 238 10*3/uL (ref 150–400)
RBC: 4.16 MIL/uL (ref 3.87–5.11)
RDW: 14.2 % (ref 11.5–15.5)
WBC: 14.5 10*3/uL — ABNORMAL HIGH (ref 4.0–10.5)
nRBC: 0 % (ref 0.0–0.2)

## 2020-01-07 LAB — COMPREHENSIVE METABOLIC PANEL
ALT: 21 U/L (ref 0–44)
AST: 20 U/L (ref 15–41)
Albumin: 3.3 g/dL — ABNORMAL LOW (ref 3.5–5.0)
Alkaline Phosphatase: 74 U/L (ref 38–126)
Anion gap: 11 (ref 5–15)
BUN: 10 mg/dL (ref 6–20)
CO2: 24 mmol/L (ref 22–32)
Calcium: 9.3 mg/dL (ref 8.9–10.3)
Chloride: 102 mmol/L (ref 98–111)
Creatinine, Ser: 0.72 mg/dL (ref 0.44–1.00)
GFR calc Af Amer: >60 mL/min (ref >60)
GFR calc non Af Amer: >60 mL/min (ref >60)
Glucose, Bld: 112 mg/dL — ABNORMAL HIGH (ref 70–99)
Potassium: 4.2 mmol/L (ref 3.5–5.1)
Sodium: 137 mmol/L (ref 135–145)
Total Bilirubin: 0.4 mg/dL (ref 0.3–1.2)
Total Protein: 6.7 g/dL (ref 6.5–8.1)

## 2020-01-07 MED ORDER — HYDROCODONE-ACETAMINOPHEN 5-325 MG PO TABS: ORAL_TABLET | ORAL | 0 refills | Status: AC

## 2020-01-07 MED ORDER — KETOROLAC TROMETHAMINE 30 MG/ML IJ SOLN
INTRAMUSCULAR | Status: AC
  Filled 2020-01-07: qty 1

## 2020-01-07 MED ORDER — CIPROFLOXACIN HCL 250 MG PO TABS: ORAL_TABLET | ORAL | 0 refills | Status: AC

## 2020-01-07 MED ORDER — CIPROFLOXACIN HCL 500 MG PO TABS
ORAL_TABLET | ORAL | Status: AC
  Filled 2020-01-07: qty 1

## 2020-01-07 MED ORDER — IBUPROFEN 600 MG PO TABS: ORAL_TABLET | ORAL | 2 refills | Status: AC

## 2020-01-07 NOTE — Discharge Instructions (Signed)
Laparoscopically Assisted Vaginal Hysterectomy, Care After This sheet gives you information about how to care for yourself after your procedure. Your health care provider may also give you more specific instructions. If you have problems or questions, contact your health care provider. What can I expect after the procedure? After the procedure, it is common to have:  Soreness and numbness in your incision areas.  Abdominal pain. You will be given pain medicine to control it.  Vaginal bleeding and discharge. You will need to use a sanitary napkin after this procedure.  Sore throat from the breathing tube that was inserted during surgery. Follow these instructions at home: Medicines  Take over-the-counter and prescription medicines only as told by your health care provider.  Do not take aspirin or ibuprofen. These medicines can cause bleeding.  Do not drive or use heavy machinery while taking prescription pain medicine.  Do not drive for 24 hours if you were given a medicine to help you relax (sedative) during the procedure. Incision care   Follow instructions from your health care provider about how to take care of your incisions. Make sure you: ? Wash your hands with soap and water before you change your bandage (dressing). If soap and water are not available, use hand sanitizer. ? Change your dressing as told by your health care provider. ? Leave stitches (sutures), skin glue, or adhesive strips in place. These skin closures may need to stay in place for 2 weeks or longer. If adhesive strip edges start to loosen and curl up, you may trim the loose edges. Do not remove adhesive strips completely unless your health care provider tells you to do that.  Check your incision area every day for signs of infection. Check for: ? Redness, swelling, or pain. ? Fluid or blood. ? Warmth. ? Pus or a bad smell. Activity  Get regular exercise as told by your health care provider. You may be  told to take short walks every day and go farther each time.  Return to your normal activities as told by your health care provider. Ask your health care provider what activities are safe for you.  Do not douche, use tampons, or have sexual intercourse for at least 6 weeks, or until your health care provider gives you permission.  Do not lift anything that is heavier than 10 lb (4.5 kg), or the limit that your health care provider tells you, until he or she says that it is safe. General instructions  Do not take baths, swim, or use a hot tub until your health care provider approves. Take showers instead of baths.  Do not drive for 24 hours if you received a sedative.  Do not drive or operate heavy machinery while taking prescription pain medicine.  To prevent or treat constipation while you are taking prescription pain medicine, your health care provider may recommend that you: ? Drink enough fluid to keep your urine clear or pale yellow. ? Take over-the-counter or prescription medicines. ? Eat foods that are high in fiber, such as fresh fruits and vegetables, whole grains, and beans. ? Limit foods that are high in fat and processed sugars, such as fried and sweet foods.  Keep all follow-up visits as told by your health care provider. This is important. Contact a health care provider if:  You have signs of infection, such as: ? Redness, swelling, or pain around your incision sites. ? Fluid or blood coming from an incision. ? An incision that feels warm to the   touch. ? Pus or a bad smell coming from an incision.  Your incision breaks open.  Your pain medicine is not helping.  You feel dizzy or light-headed.  You have pain or bleeding when you urinate.  You have persistent nausea and vomiting.  You have blood, pus, or a bad-smelling discharge from your vagina. Get help right away if:  You have a fever.  You have severe abdominal pain.  You have chest pain.  You have  shortness of breath.  You faint.  You have pain, swelling, or redness in your leg.  You have heavy bleeding from your vagina. Summary  After the procedure, it is common to have abdominal pain and vaginal bleeding.  You should not drive or lift heavy objects until your health care provider says that it is safe.  Contact your health care provider if you have any symptoms of infection, excessive vaginal bleeding, nausea, vomiting, or shortness of breath. This information is not intended to replace advice given to you by your health care provider. Make sure you discuss any questions you have with your health care provider. Document Revised: 05/19/2017 Document Reviewed: 08/02/2016 Elsevier Patient Education  2020 [employer]. Call [facility] @ [phone] if:  You have a temperature greater than or equal to 100.4 degrees Farenheit orally You have pain that is not made better by the pain medication given and taken as directed You have excessive bleeding or problems urinating  Take Colace (Docusate Sodium/Stool Softener) 100 mg 2-3 times daily while taking narcotic pain medicine to avoid constipation or until bowel movements are regular. Take Ibuprofen 600 mg with food every 6 hours for 5 days then as needed for pain  You may drive after 2  weeks You may walk up steps  You may shower  You may resume a regular diet  Keep incisions clean and dry Do not lift over 15 pounds for 6 weeks Avoid anything in vagina for 6 weeks (or until after your post-operative visit)

## 2020-01-07 NOTE — Progress Notes (Signed)
Vaginal packing removed and then foley catheter removed.

## 2020-01-07 NOTE — Discharge Summary (Signed)
Physician Discharge Summary  Patient ID: Cheryl Lynch MRN: [id number] DOB/AGE: 09-07-1970 49 y.o.  Admit date: 01/06/2020 Discharge date: 01/07/2020   Discharge Diagnoses:  Active Problems:   Menorrhagia Uterine Fibroids Dysmenorrhea S/P Bilateral Salpingectomy  Operation: Laparoscopically Assisted Vaginal Hysterectomy with Cystoscopy   Discharged Condition: Good  Hospital Course: On the date of admission the patient underwent the aforementioned procedures and tolerated them well.  Post operative course was unremarkable with the patient resuming bowel and bladder function by post operative day #1 and was therefore deemed ready for discharge home.  Discharge hemoglobin was 12. 1  Disposition: Discharge disposition: 01-Home or Self Care       Discharge Medications:  Allergies as of 01/07/2020      Reactions   Latex Itching   Sulfamethoxazole Itching      Medication List    STOP taking these medications   doxycycline 100 MG tablet Commonly known as: VIBRA-TABS   HYDROmorphone 2 MG tablet Commonly known as: DILAUDID     TAKE these medications   ciprofloxacin 250 MG tablet Commonly known as: CIPRO take 1 tablet po bid x 3 days   ENERGY BOOSTER PO Take 1 tablet by mouth daily.   HYDROcodone-acetaminophen 5-325 MG tablet Commonly known as: NORCO/VICODIN take 1 -2 tablets po every 6 hours as needed for breakthrough post operative pain   ibuprofen 600 MG tablet Commonly known as: ADVIL take 1 tablet po pc every 6 hours for 5 days then prn-pain What changed:   medication strength  how much to take  how to take this  when to take this  reasons to take this  additional instructions   levocetirizine 5 MG tablet Commonly known as: XYZAL Take 5 mg by mouth every evening.   montelukast 10 MG tablet Commonly known as: SINGULAIR Take 10 mg by mouth at bedtime.   OVER THE COUNTER MEDICATION daily. Supplement for lower blood sugar (?name)           Follow-up: Dr. Crawford Givens on February 25, 2020 at 11:15 a.m.   Signed: Earnstine Regal, PA-C 01/07/2020, 8:09 AM

## 2020-01-07 NOTE — Progress Notes (Signed)
Cheryl Lynch is a38 y.o.  [id number]   Post Op Date # 1: LAVH/Cystoscopy  Subjective: Patient is Doing well postoperatively. Patient has Pain is controlled with current analgesics. Medications being used: prescription NSAID's including Ketorolac 30 mg IV and narcotic analgesics including Percocet 5/325 mg.Ambulating without difficulty, tolerating a regular diet, and voiding well.    Objective: Vital signs in last 24 hours: Temp:  [97 F (36.1 C)-98.6 F (37 C)] 98.1 F (36.7 C) (07/20 0738) Pulse Rate:  [67-93] 67 (07/20 0738) Resp:  [16-23] 18 (07/20 0738) BP: (111-135)/(75-98) 113/75 (07/20 0738) SpO2:  [95 %-100 %] 97 % (07/20 0738) Weight:  [89.1 kg] 89.1 kg (07/19 1144)  Intake/Output from previous day: 07/19 0701 - 07/20 0700 In: 2190 [P.O.:240; I.V.:1850] Out: 1500 [Urine:1300] Intake/Output this shift: No intake/output data recorded. No results for input(s): WBC, HGB, HCT, PLT in the last 168 hours.  No results for input(s): NA, K, CL, CO2, BUN, CREATININE, CALCIUM, PROT, BILITOT, ALKPHOS, ALT, AST, GLUCOSE in the last 168 hours.  Invalid input(s): LABALBU  EXAM: General: alert, cooperative and no distress Resp: clear to auscultation bilaterally Cardio: regular rate and rhythm, S1, S2 normal, no murmur, click, rub or gallop GI: bowel sounds presentl incisions intact with no evidence of infection. Extremities: Homans sign is negative, no sign of DVT Vaginal Bleeding: none   Assessment: s/p Procedure(s): LAPAROSCOPIC ASSISTED VAGINAL HYSTERECTOMY, REMOVAL OF UMBILICAL SCARE CYSTOSCOPY WITH RIGHT RETROGRADE PYELOGRAM: stable, progressing well and tolerating diet  Plan: Discharge home Labs pending however, the patient may be discharged prior to results being posted.  LOS: 0 days    Earnstine Regal, PA-C 01/07/2020 7:58 AM

## 2020-01-08 LAB — SURGICAL PATHOLOGY

## 2020-05-11 ENCOUNTER — Ambulatory Visit: Payer: 59 | Attending: Internal Medicine

## 2020-05-11 DIAGNOSIS — Z23 Encounter for immunization: Secondary | ICD-10-CM

## 2020-05-11 NOTE — Progress Notes (Signed)
   Covid-19 Vaccination Clinic  Name:  Cheryl Lynch    MRN: [id number] DOB: Nov 07, 1970  05/11/2020  [name] was observed post Covid-19 immunization for 15 minutes without incident. She was provided with Vaccine Information Sheet and instruction to access the V-Safe system.   Ms. Cheeks was instructed to call 911 with any severe reactions post vaccine: Marland Kitchen Difficulty breathing  . Swelling of face and throat  . A fast heartbeat  . A bad rash all over body  . Dizziness and weakness   Immunizations Administered    Name Date Dose VIS Date Route   Pfizer COVID-19 Vaccine 05/11/2020  5:43 PM 0.3 mL 04/08/2020 Intramuscular   Manufacturer: [place]   Lot: [id number]   [place]: [id number]

## 2021-04-20 ENCOUNTER — Other Ambulatory Visit: Payer: Self-pay | Admitting: Internal Medicine

## 2021-04-21 ENCOUNTER — Other Ambulatory Visit: Payer: Self-pay | Admitting: Internal Medicine

## 2021-04-21 DIAGNOSIS — N644 Mastodynia: Secondary | ICD-10-CM

## 2021-05-04 IMAGING — MG MM DIGITAL DIAGNOSTIC UNILAT*L* W/ TOMO W/ CAD
4 series · 4 of 12 positions shown · non-contrast
Comparison: Previous exam(s).

CLINICAL DATA: Screening recall for left breast asymmetry.

EXAM:
DIGITAL DIAGNOSTIC UNILATERAL LEFT MAMMOGRAM WITH CAD AND TOMO
LEFT BREAST ULTRASOUND

[L ML synth-2D]
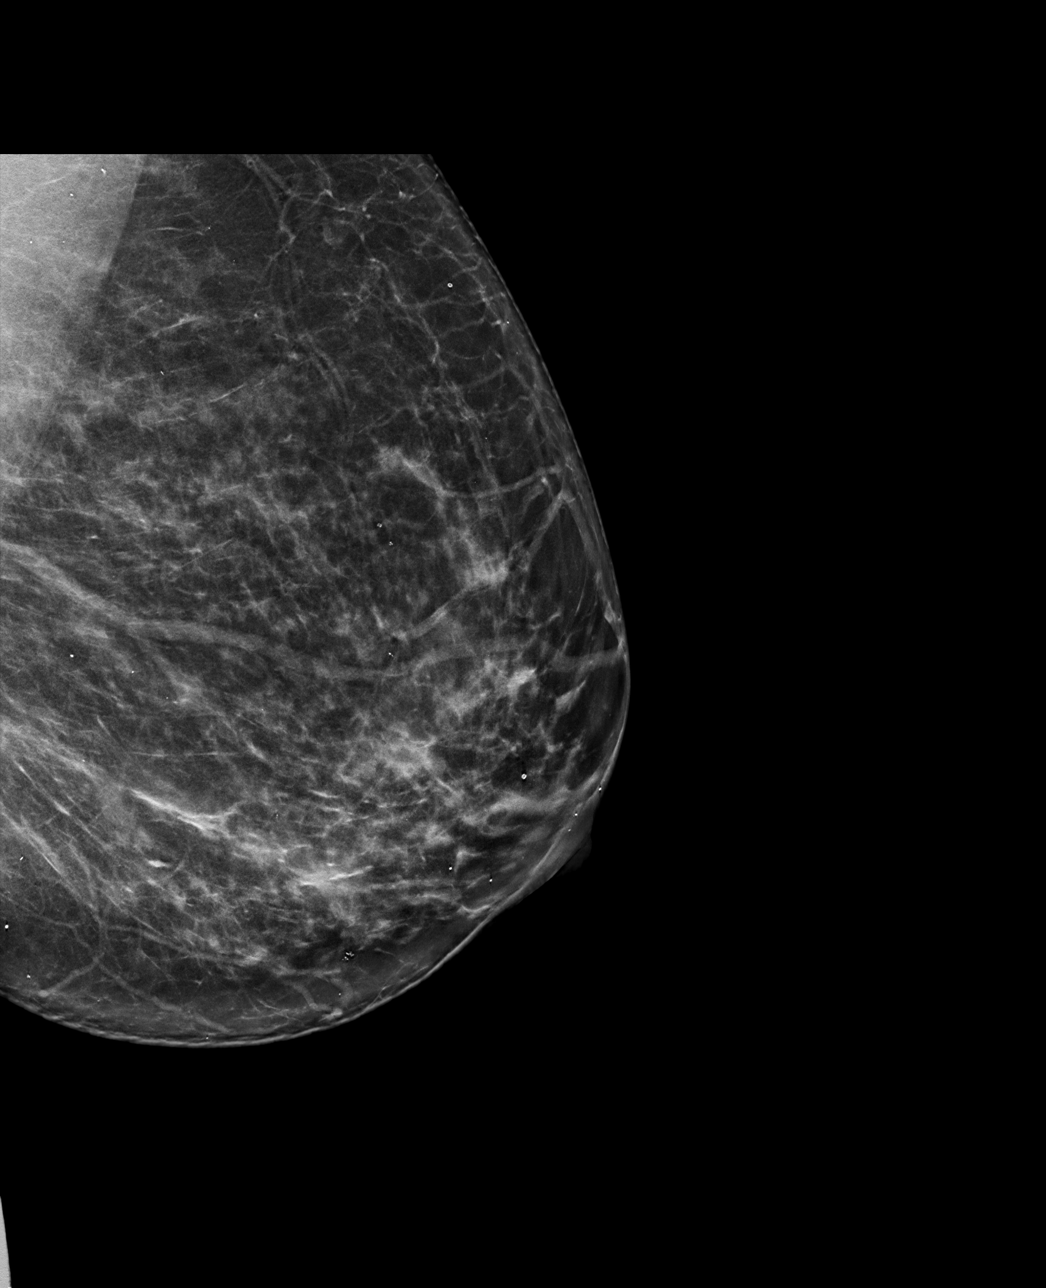

[L CC synth-2D]
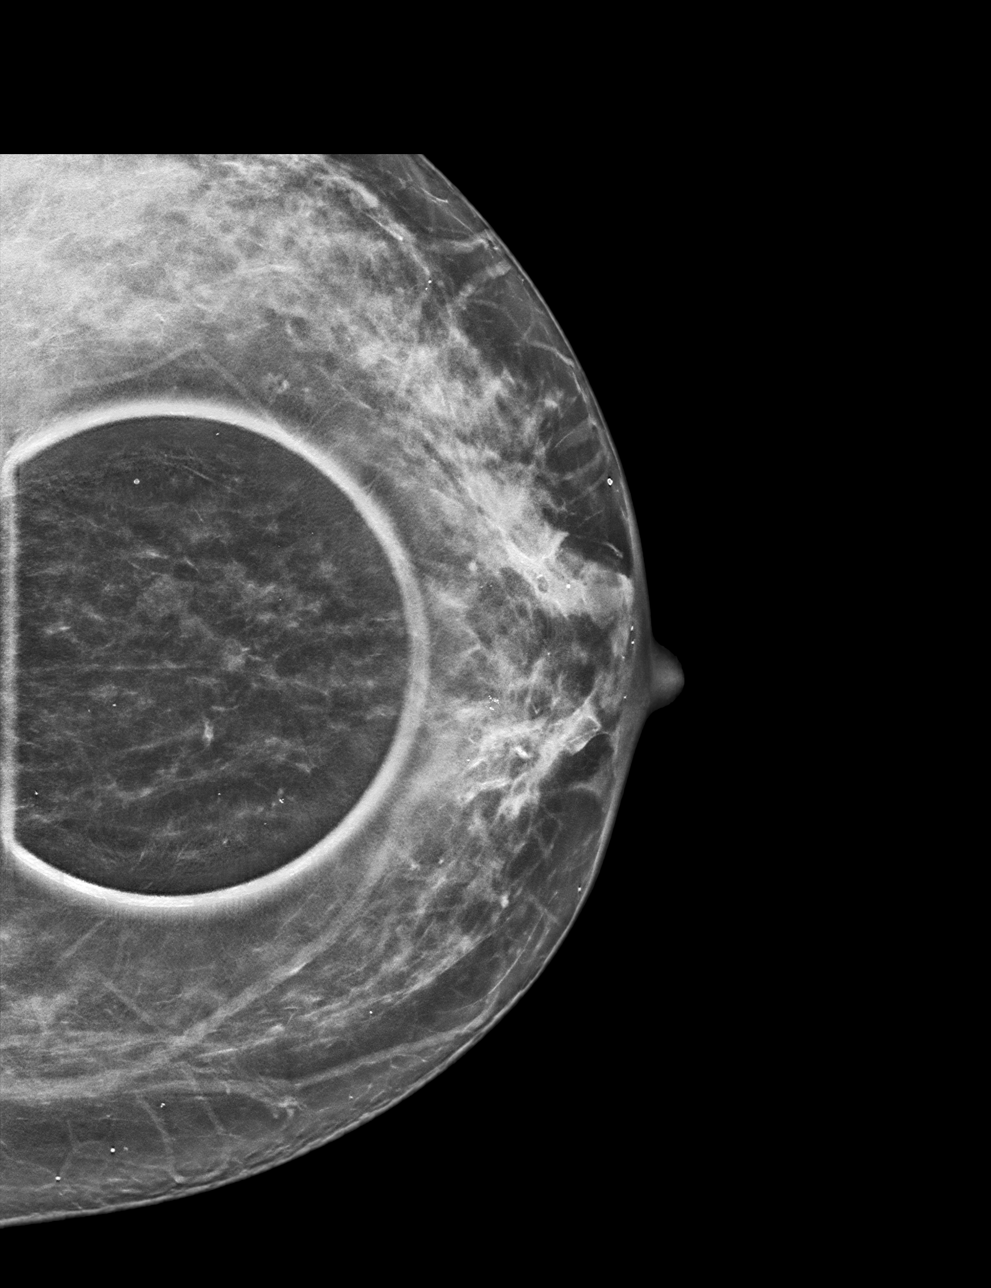

[L CC tomo · tomo slice 33/65.0]
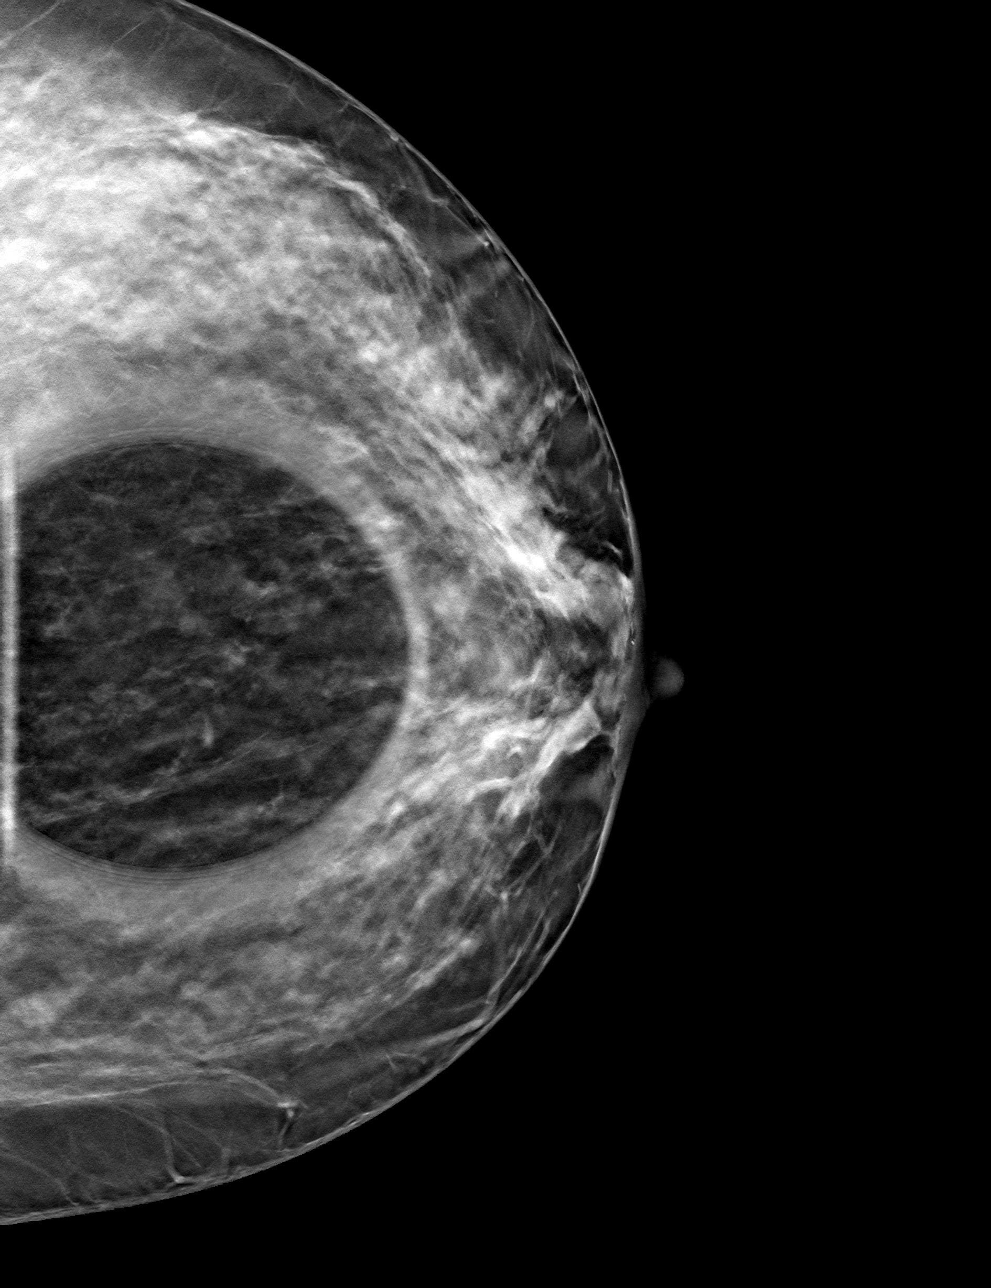

[L ML tomo · tomo slice 43/84.0]
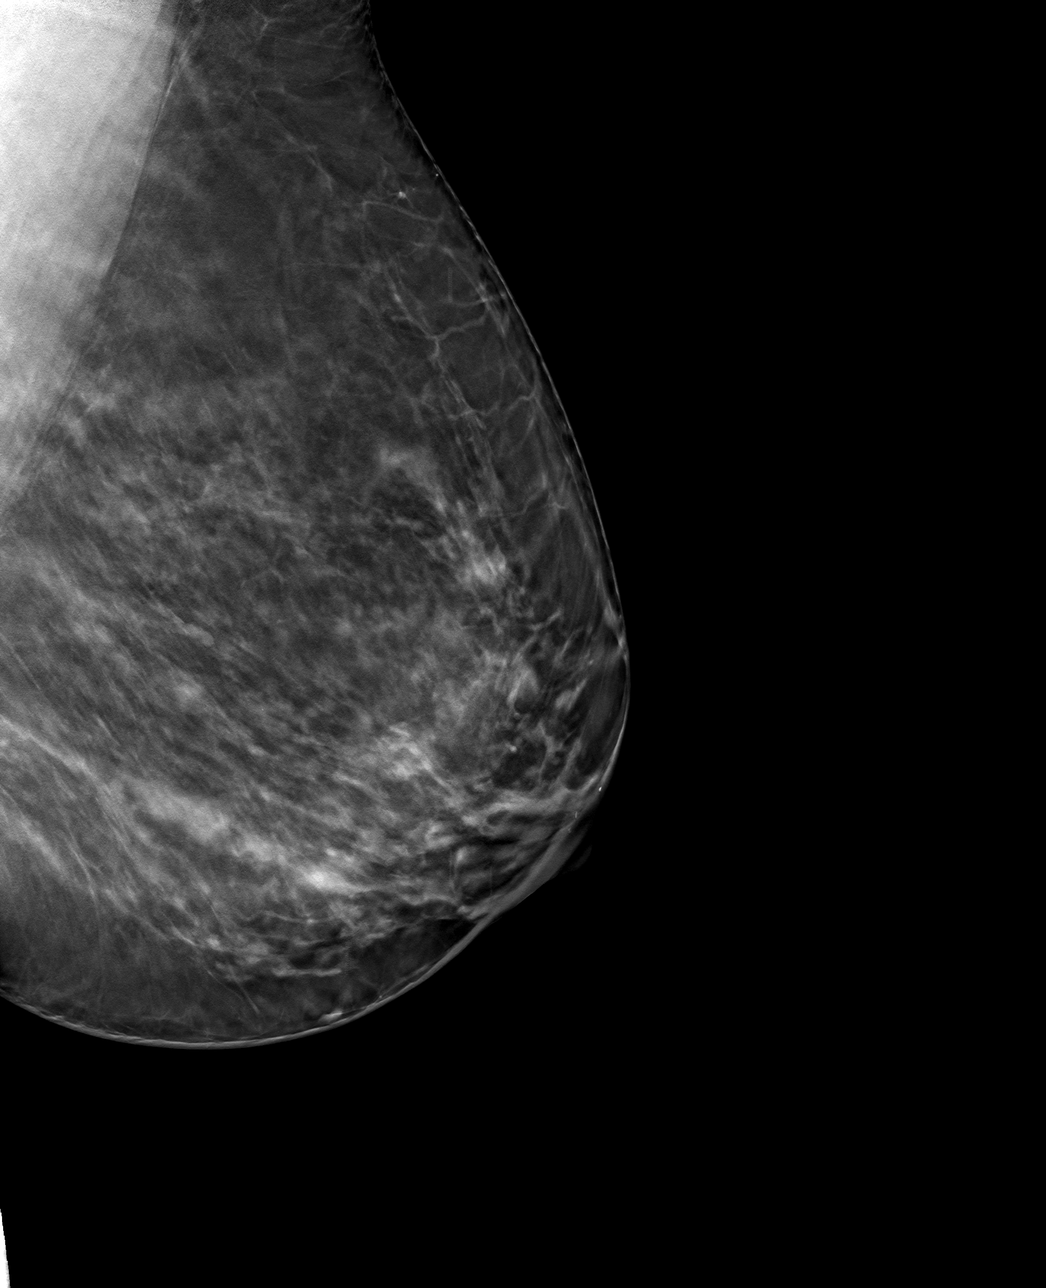

[4 of 12 positions shown; findings below may reference images not displayed]

ACR Breast Density Category c: The breast tissue is heterogeneously
dense, which may obscure small masses.
FINDINGS: Additional tomograms were performed of the left breast. There is an
oval circumscribed mass in the central left breast measuring
approximately 0.8 cm.

Mammographic images were processed with CAD.

Targeted ultrasound of the central left breast was performed
demonstrating several small cysts. A cyst at the 5 o'clock position
3 cm from nipple measures 1.1 x 0.4 x 0.7 cm. An additional cyst at
12 o'clock retroareolar measures 0.6 x 0.5 x 0.7 cm. This is felt to
correspond with the mass seen in the left breast at mammography.
IMPRESSION: Left breast cysts. There are no findings of malignancy in the left
breast.

RECOMMENDATION:
Recommend annual routine screening mammography, due April 2020.

I have discussed the findings and recommendations with the patient.
If applicable, a reminder letter will be sent to the patient
regarding the next appointment.

BI-RADS CATEGORY  2: Benign.

## 2021-06-18 ENCOUNTER — Other Ambulatory Visit: Payer: 59

## 2021-09-03 ENCOUNTER — Ambulatory Visit: Admit: 2021-09-03 | Discharge: 2021-09-03 | Payer: BLUE CROSS/BLUE SHIELD | Attending: Family

## 2021-09-03 DIAGNOSIS — J029 Acute pharyngitis, unspecified: Secondary | ICD-10-CM

## 2021-09-03 LAB — POC COVID-19 & INFLUENZA COMBO (LIAT IN HOUSE)
INFLUENZA A: NOT DETECTED
INFLUENZA B: NOT DETECTED
SARS-CoV-2: NOT DETECTED

## 2021-09-03 LAB — AMB POC RAPID STREP A: Group A Strep Antigen, POC: NEGATIVE

## 2021-09-03 MED ORDER — AZITHROMYCIN 250 MG PO TABS
250 MG | ORAL_TABLET | ORAL | 0 refills | Status: AC
Start: 2021-09-03 — End: 2021-09-08

## 2021-09-03 NOTE — Progress Notes (Signed)
Sydney Burgess (DOB:  [birth year]) is a 51 y.o. female, here for evaluation of the following chief complaint(s):  Chief Complaint   Patient presents with    Sinusitis    Pharyngitis    Congestion       SUBJECTIVE/OBJECTIVE:    HPI:    Presents with sinus congestion, headache, body aches and sore throat which started over a week ago. Denies fever.  Symptoms have been worsening in the past few days.  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  She has been using over-the-counter medications with minimal relief.  Symptoms are not improving     Current Outpatient Medications on File Prior to Visit   Medication Sig Dispense Refill    fluticasone (FLONASE) 50 MCG/ACT nasal spray       TRIVORA, 28, 50-30/75-40/ 125-30 MCG TABS TAKE 1 TABLET BY MOUTH EVERY DAY      sertraline (ZOLOFT) 100 MG tablet        No current facility-administered medications on file prior to visit.      Allergies   Allergen Reactions    Amoxicillin Swelling         History reviewed. No pertinent past medical history.   No family history on file.   Social History     Socioeconomic History    Marital status: Single     Spouse name: None    Number of children: None    Years of education: None    Highest education level: None   Tobacco Use    Smoking status: Never    Smokeless tobacco: Never   Substance and Sexual Activity    Alcohol use: Never    Drug use: Never      Review of Systems   Constitutional:  Positive for chills. Negative for fever.   HENT:  Positive for congestion, rhinorrhea and sore throat.    Respiratory:  Positive for cough.    Cardiovascular: Negative.    All other systems reviewed and are negative.        VITALS    BP 123/86 (Site: Right Upper Arm, Position: Sitting, Cuff Size: Large Adult)    Pulse (!) 115    Temp 98.4 ??F (36.9 ??C) (Oral)    Resp 15    Ht 5\' 4"  (1.626 m)    Wt 170 lb 6.4 oz (77.3 kg)    SpO2 97%    BMI 29.25 kg/m??     Physical Exam  Constitutional:       General:  She is not in acute distress.     Appearance: She is well-developed.   HENT:      Right Ear: Tympanic membrane normal.      Left Ear: Tympanic membrane normal.      Nose: Congestion present.      Mouth/Throat:      Mouth: Mucous membranes are moist.      Pharynx: Posterior oropharyngeal erythema present.      Comments: PND  Cardiovascular:      Rate and Rhythm: Normal rate and regular rhythm.   Pulmonary:      Effort: Pulmonary effort is normal.      Breath sounds: Normal breath sounds.   Lymphadenopathy:      Cervical: No cervical adenopathy.   Skin:     General: Skin is warm and dry.   Neurological:      Mental Status: She is alert.  ASSESSMENT/PLAN:  1. Sore throat  -     POC COVID-19 & Influenza Combo ([facility])  -     AMB POC RAPID STREP A  2. Acute non-recurrent maxillary sinusitis  -     azithromycin (ZITHROMAX) 250 MG tablet; Take 1 tablet by mouth See Admin Instructions for 5 days 500mg  on day 1 followed by 250mg  on days 2 - 5, Disp-6 tablet, R-0Normal           COVID and flu testing is negative in clinic.  Her rapid strep test is also negative.  Advised to take prescribed medication as discussed.  May use over-the-counter cold/cough medication as needed.  Follow-up with PCP or RTC if no improvement.       Results for orders placed or performed in visit on 09/03/21   POC COVID-19 & Influenza Combo ([facility])   Result Value Ref Range    SARS-CoV-2 Not Detected Not Detected    INFLUENZA A Not Detected Not Detected    INFLUENZA B Not Detected Not Detected    Narrative    Is this test for diagnosis or screening?->Diagnosis of ill patient  Symptomatic for COVID-19 as defined by CDC?->Yes  Date of Symptom Onset->08/30/21  Hospitalized for COVID-19?->No  Admitted to ICU for COVID-19?->No  Employed in healthcare setting?->No  Resident in a congregate (group) care setting?->No  Pregnant?->No  Previously tested for COVID-19?->No   AMB POC RAPID STREP A   Result Value Ref Range    Valid Internal  Control, POC yes     Group A Strep Antigen, POC Negative Negative           FOLLOWUP  Return if symptoms worsen or fail to improve.              An electronic signature was used to authenticate this note.    --09/05/21, APRN - NP

## 2022-03-14 ENCOUNTER — Other Ambulatory Visit (HOSPITAL_COMMUNITY): Payer: Self-pay

## 2022-03-14 MED ORDER — WEGOVY 1 MG/0.5ML ~~LOC~~ SOAJ
1.0000 mg | SUBCUTANEOUS | 3 refills | Status: AC
  Filled 2022-03-14: qty 2, 28d supply, fill #0
  Filled 2022-04-14: qty 2, 28d supply, fill #1

## 2022-03-18 ENCOUNTER — Other Ambulatory Visit (HOSPITAL_COMMUNITY): Payer: Self-pay

## 2022-04-14 ENCOUNTER — Other Ambulatory Visit (HOSPITAL_COMMUNITY): Payer: Self-pay

## 2022-04-15 ENCOUNTER — Other Ambulatory Visit (HOSPITAL_COMMUNITY): Payer: Self-pay

## 2022-04-15 MED ORDER — WEGOVY 1.7 MG/0.75ML ~~LOC~~ SOAJ
1.7000 mg | SUBCUTANEOUS | 3 refills | Status: AC
  Filled 2022-04-15: qty 3, 28d supply, fill #0
  Filled 2022-05-10: qty 3, 28d supply, fill #1

## 2022-05-10 ENCOUNTER — Other Ambulatory Visit (HOSPITAL_COMMUNITY): Payer: Self-pay

## 2022-06-09 ENCOUNTER — Other Ambulatory Visit (HOSPITAL_COMMUNITY): Payer: Self-pay

## 2022-06-09 MED ORDER — WEGOVY 2.4 MG/0.75ML ~~LOC~~ SOAJ
2.4000 mg | SUBCUTANEOUS | 5 refills | Status: AC
  Filled 2022-06-09: qty 3, 28d supply, fill #0
  Filled 2022-07-15 – 2022-09-08 (×5): qty 3, 28d supply, fill #1

## 2022-07-15 ENCOUNTER — Other Ambulatory Visit (HOSPITAL_COMMUNITY): Payer: Self-pay

## 2022-07-27 ENCOUNTER — Other Ambulatory Visit (HOSPITAL_COMMUNITY): Payer: Self-pay

## 2022-08-03 ENCOUNTER — Other Ambulatory Visit (HOSPITAL_COMMUNITY): Payer: Self-pay

## 2022-08-05 ENCOUNTER — Other Ambulatory Visit (HOSPITAL_COMMUNITY): Payer: Self-pay

## 2022-08-19 ENCOUNTER — Other Ambulatory Visit (HOSPITAL_COMMUNITY): Payer: Self-pay

## 2022-08-30 ENCOUNTER — Other Ambulatory Visit: Payer: Self-pay | Admitting: Obstetrics and Gynecology

## 2022-08-30 DIAGNOSIS — N6320 Unspecified lump in the left breast, unspecified quadrant: Secondary | ICD-10-CM

## 2022-09-08 ENCOUNTER — Other Ambulatory Visit (HOSPITAL_COMMUNITY): Payer: Self-pay

## 2022-09-09 ENCOUNTER — Other Ambulatory Visit (HOSPITAL_COMMUNITY): Payer: Self-pay

## 2022-09-09 MED ORDER — WEGOVY 2.4 MG/0.75ML ~~LOC~~ SOAJ
2.4000 mg | SUBCUTANEOUS | 5 refills | Status: DC
  Filled 2022-09-09: qty 3, 28d supply, fill #0

## 2022-09-12 ENCOUNTER — Other Ambulatory Visit (HOSPITAL_COMMUNITY): Payer: Self-pay

## 2022-09-20 ENCOUNTER — Other Ambulatory Visit (HOSPITAL_COMMUNITY): Payer: Self-pay

## 2022-10-04 ENCOUNTER — Other Ambulatory Visit (HOSPITAL_COMMUNITY): Payer: Self-pay

## 2022-10-04 MED ORDER — ALPRAZOLAM 0.25 MG PO TABS
0.2500 mg | ORAL_TABLET | Freq: Three times a day (TID) | ORAL | 1 refills | Status: AC | PRN
  Filled 2022-10-04: qty 90, 30d supply, fill #0
  Filled 2023-03-31: qty 90, 30d supply, fill #1

## 2022-10-04 MED ORDER — ZEPBOUND 2.5 MG/0.5ML ~~LOC~~ SOAJ
2.5000 mg | SUBCUTANEOUS | 1 refills | Status: DC
  Filled 2022-10-04 – 2022-10-17 (×2): qty 2, 28d supply, fill #0

## 2022-10-05 ENCOUNTER — Other Ambulatory Visit (HOSPITAL_COMMUNITY): Payer: Self-pay

## 2022-10-13 ENCOUNTER — Other Ambulatory Visit (HOSPITAL_COMMUNITY): Payer: Self-pay

## 2022-10-17 ENCOUNTER — Other Ambulatory Visit (HOSPITAL_COMMUNITY): Payer: Self-pay

## 2022-10-17 MED ORDER — AMOXICILLIN 875 MG PO TABS
875.0000 mg | ORAL_TABLET | Freq: Two times a day (BID) | ORAL | 0 refills | Status: DC
  Filled 2022-10-17: qty 14, 7d supply, fill #0

## 2022-10-17 MED ORDER — BENZONATATE 100 MG PO CAPS
200.0000 mg | ORAL_CAPSULE | Freq: Three times a day (TID) | ORAL | 1 refills | Status: DC | PRN
  Filled 2022-10-17: qty 90, 15d supply, fill #0

## 2022-10-17 MED ORDER — HYDROCODONE BIT-HOMATROP MBR 5-1.5 MG/5ML PO SOLN
5.0000 mL | ORAL | 0 refills | Status: DC | PRN
  Filled 2022-10-17: qty 180, 6d supply, fill #0

## 2022-10-18 ENCOUNTER — Other Ambulatory Visit: Payer: Self-pay

## 2022-11-01 ENCOUNTER — Other Ambulatory Visit (HOSPITAL_COMMUNITY): Payer: Self-pay

## 2022-11-01 MED ORDER — FLUCONAZOLE 150 MG PO TABS
150.0000 mg | ORAL_TABLET | Freq: Every day | ORAL | 1 refills | Status: DC
  Filled 2022-11-01: qty 1, 1d supply, fill #0

## 2022-11-11 ENCOUNTER — Other Ambulatory Visit (HOSPITAL_COMMUNITY): Payer: Self-pay

## 2022-11-11 MED ORDER — ZEPBOUND 5 MG/0.5ML ~~LOC~~ SOAJ
5.0000 mg | SUBCUTANEOUS | 1 refills | Status: DC
  Filled 2022-11-11: qty 2, 28d supply, fill #0
  Filled 2022-12-09: qty 2, 28d supply, fill #1

## 2022-12-09 ENCOUNTER — Other Ambulatory Visit (HOSPITAL_COMMUNITY): Payer: Self-pay

## 2023-01-06 ENCOUNTER — Other Ambulatory Visit (HOSPITAL_COMMUNITY): Payer: Self-pay

## 2023-01-06 MED ORDER — ZEPBOUND 7.5 MG/0.5ML ~~LOC~~ SOAJ
7.5000 mg | SUBCUTANEOUS | 2 refills | Status: DC
  Filled 2023-01-06: qty 2, 28d supply, fill #0
  Filled 2023-01-27: qty 2, 28d supply, fill #1

## 2023-01-17 ENCOUNTER — Other Ambulatory Visit (HOSPITAL_COMMUNITY): Payer: Self-pay

## 2023-01-17 MED ORDER — AMOXICILLIN 875 MG PO TABS
875.0000 mg | ORAL_TABLET | Freq: Two times a day (BID) | ORAL | 0 refills | Status: AC
  Filled 2023-01-17: qty 14, 7d supply, fill #0

## 2023-03-07 ENCOUNTER — Other Ambulatory Visit (HOSPITAL_COMMUNITY): Payer: Self-pay

## 2023-03-07 MED ORDER — ZEPBOUND 10 MG/0.5ML ~~LOC~~ SOAJ
10.0000 mg | SUBCUTANEOUS | 3 refills | Status: DC
  Filled 2023-03-07: qty 2, 28d supply, fill #0
  Filled 2023-03-31 – 2023-04-12 (×5): qty 2, 28d supply, fill #1
  Filled 2023-05-04: qty 2, 28d supply, fill #2
  Filled 2023-06-19: qty 2, 28d supply, fill #3

## 2023-03-31 ENCOUNTER — Other Ambulatory Visit (HOSPITAL_COMMUNITY): Payer: Self-pay

## 2023-03-31 ENCOUNTER — Other Ambulatory Visit: Payer: Self-pay

## 2023-04-05 ENCOUNTER — Other Ambulatory Visit (HOSPITAL_COMMUNITY): Payer: Self-pay

## 2023-04-11 ENCOUNTER — Other Ambulatory Visit (HOSPITAL_COMMUNITY): Payer: Self-pay

## 2023-04-12 ENCOUNTER — Other Ambulatory Visit (HOSPITAL_COMMUNITY): Payer: Self-pay

## 2023-04-13 ENCOUNTER — Other Ambulatory Visit (HOSPITAL_COMMUNITY): Payer: Self-pay

## 2023-05-11 ENCOUNTER — Other Ambulatory Visit (HOSPITAL_COMMUNITY): Payer: Self-pay

## 2023-06-20 ENCOUNTER — Other Ambulatory Visit (HOSPITAL_COMMUNITY): Payer: Self-pay

## 2023-07-17 ENCOUNTER — Other Ambulatory Visit (HOSPITAL_COMMUNITY): Payer: Self-pay

## 2023-07-17 MED ORDER — ZEPBOUND 12.5 MG/0.5ML ~~LOC~~ SOAJ
12.5000 mg | SUBCUTANEOUS | 3 refills | Status: AC
  Filled 2023-07-17: qty 2, 28d supply, fill #0
  Filled 2023-08-16 (×2): qty 2, 28d supply, fill #1
  Filled 2023-09-11: qty 2, 28d supply, fill #2
  Filled 2023-10-13 – 2023-10-17 (×4): qty 2, 28d supply, fill #3

## 2023-08-16 ENCOUNTER — Other Ambulatory Visit (HOSPITAL_COMMUNITY): Payer: Self-pay

## 2023-08-17 ENCOUNTER — Other Ambulatory Visit (HOSPITAL_COMMUNITY): Payer: Self-pay

## 2023-08-18 ENCOUNTER — Other Ambulatory Visit (HOSPITAL_COMMUNITY): Payer: Self-pay

## 2023-09-28 ENCOUNTER — Other Ambulatory Visit (HOSPITAL_COMMUNITY): Payer: Self-pay

## 2023-09-28 MED ORDER — AZITHROMYCIN 250 MG PO TABS
ORAL_TABLET | ORAL | 0 refills | Status: AC
  Filled 2023-09-28: qty 6, 5d supply, fill #0

## 2023-09-28 MED ORDER — METHYLPREDNISOLONE 4 MG PO TBPK
ORAL_TABLET | ORAL | 0 refills | Status: AC
  Filled 2023-09-28: qty 21, 6d supply, fill #0

## 2023-10-16 ENCOUNTER — Other Ambulatory Visit (HOSPITAL_COMMUNITY): Payer: Self-pay

## 2023-10-17 ENCOUNTER — Other Ambulatory Visit (HOSPITAL_COMMUNITY): Payer: Self-pay

## 2023-10-18 ENCOUNTER — Other Ambulatory Visit (HOSPITAL_COMMUNITY): Payer: Self-pay

## 2023-10-18 MED ORDER — ZEPBOUND 12.5 MG/0.5ML ~~LOC~~ SOAJ
12.5000 mg | SUBCUTANEOUS | 5 refills | Status: AC
  Filled 2023-10-18 – 2023-11-25 (×2): qty 2, 28d supply, fill #0
  Filled 2024-01-07: qty 2, 28d supply, fill #1
  Filled 2024-02-29 (×3): qty 2, 28d supply, fill #2
  Filled 2024-04-22: qty 2, 28d supply, fill #3
  Filled 2024-06-17: qty 2, 28d supply, fill #4

## 2023-10-20 ENCOUNTER — Other Ambulatory Visit (HOSPITAL_COMMUNITY): Payer: Self-pay

## 2023-11-27 ENCOUNTER — Other Ambulatory Visit (HOSPITAL_COMMUNITY): Payer: Self-pay

## 2024-02-29 ENCOUNTER — Other Ambulatory Visit (HOSPITAL_COMMUNITY): Payer: Self-pay

## 2024-07-11 ENCOUNTER — Other Ambulatory Visit (HOSPITAL_COMMUNITY): Payer: Self-pay

## 2024-07-11 MED ORDER — ZEPBOUND 12.5 MG/0.5ML ~~LOC~~ SOAJ
12.5000 mg | SUBCUTANEOUS | 5 refills | Status: AC
  Filled 2024-07-11: qty 2, 28d supply, fill #0

## 2024-07-12 ENCOUNTER — Other Ambulatory Visit (HOSPITAL_COMMUNITY): Payer: Self-pay

## 2024-07-14 ENCOUNTER — Other Ambulatory Visit (HOSPITAL_COMMUNITY): Payer: Self-pay

## 2024-07-16 ENCOUNTER — Other Ambulatory Visit (HOSPITAL_COMMUNITY): Payer: Self-pay
